# Patient Record
Sex: Male | Born: 1935 | Race: Black or African American | Hispanic: No | Marital: Married | Smoking: Former smoker
Health system: Southern US, Community
[De-identification: ages and names within clinical notes are randomized; demographics above are authoritative.]

## PROBLEM LIST (undated history)

## (undated) DIAGNOSIS — R7303 Prediabetes: Secondary | ICD-10-CM

## (undated) DIAGNOSIS — N529 Male erectile dysfunction, unspecified: Secondary | ICD-10-CM

## (undated) DIAGNOSIS — I10 Essential (primary) hypertension: Secondary | ICD-10-CM

## (undated) DIAGNOSIS — K219 Gastro-esophageal reflux disease without esophagitis: Secondary | ICD-10-CM

## (undated) HISTORY — DX: Male erectile dysfunction, unspecified: N52.9

## (undated) HISTORY — PX: COLONOSCOPY, ESOPHAGOGASTRODUODENOSCOPY (EGD) AND ESOPHAGEAL DILATION: SHX5781

## (undated) HISTORY — DX: Essential (primary) hypertension: I10

## (undated) HISTORY — DX: Gastro-esophageal reflux disease without esophagitis: K21.9

## (undated) HISTORY — DX: Prediabetes: R73.03

---

## 2007-11-29 ENCOUNTER — Emergency Department: Payer: Self-pay | Admitting: Emergency Medicine

## 2007-11-29 ENCOUNTER — Other Ambulatory Visit: Payer: Self-pay

## 2010-08-11 ENCOUNTER — Ambulatory Visit: Payer: Self-pay | Admitting: Family Medicine

## 2010-08-16 ENCOUNTER — Ambulatory Visit: Payer: Self-pay | Admitting: Family Medicine

## 2011-03-04 ENCOUNTER — Inpatient Hospital Stay: Payer: Self-pay | Admitting: Internal Medicine

## 2011-03-17 ENCOUNTER — Ambulatory Visit: Payer: Self-pay | Admitting: Unknown Physician Specialty

## 2011-03-21 LAB — PATHOLOGY REPORT

## 2013-07-09 IMAGING — CT CT ABD-PELV W/ CM
2 of 5 series · 12 of 32 positions shown, 18 images · non-contrast
Comparison: none

REASON FOR EXAM: (1) fever, vomiting, possible pyelonephritis; (2) fever,
vomiting, possible pyel
COMMENTS:

[Series 8: delays soft tissue · axial · 0.65mm/px · z∈[-475,-73]mm · 10 of 164 slices shown, 16 images]
[im 15/164  soft-tissue]
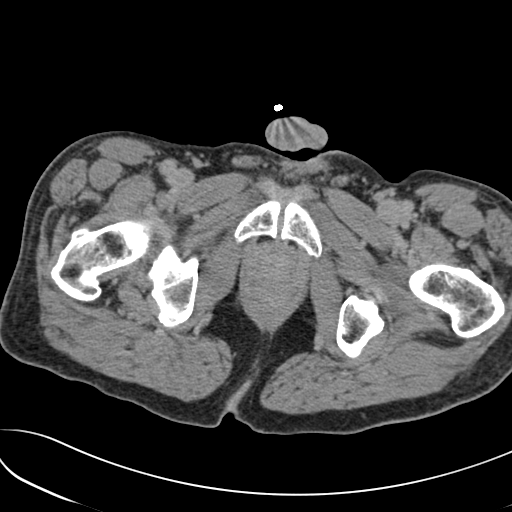
[im 15/164  bone]
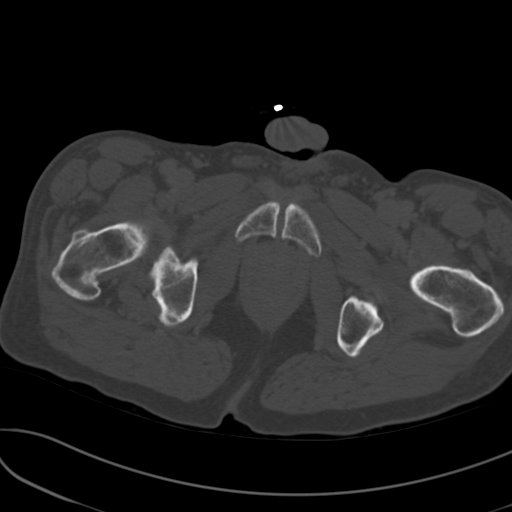
[im 30/164  soft-tissue]
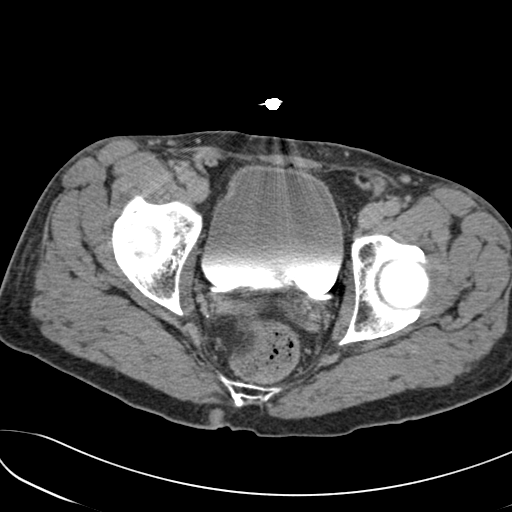
[im 45/164  soft-tissue]
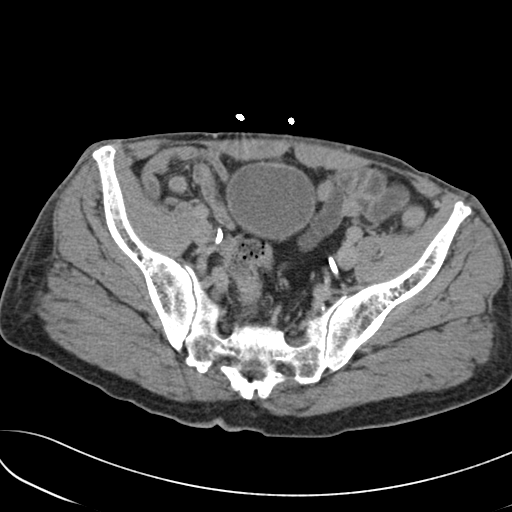
[im 60/164  soft-tissue]
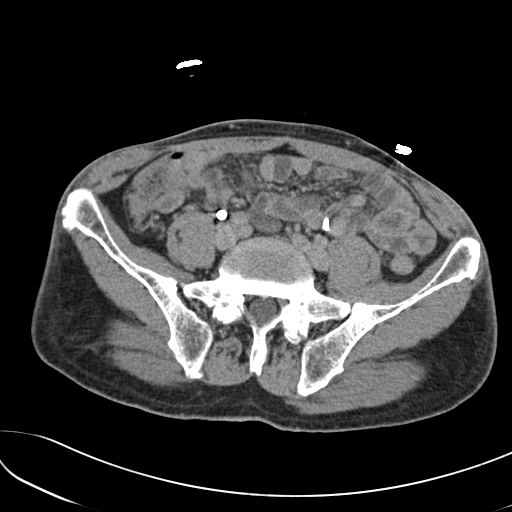
[im 75/164  soft-tissue]
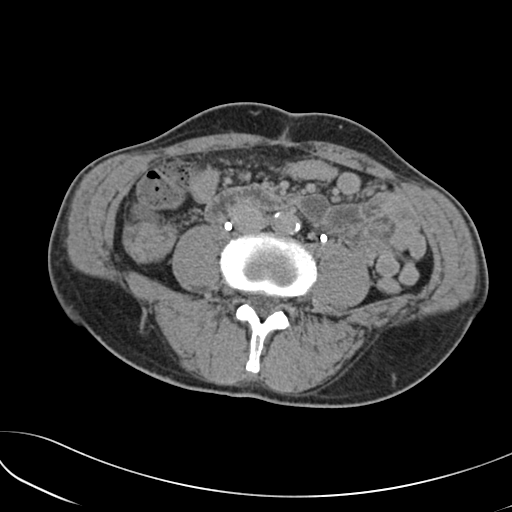
[im 89/164  soft-tissue]
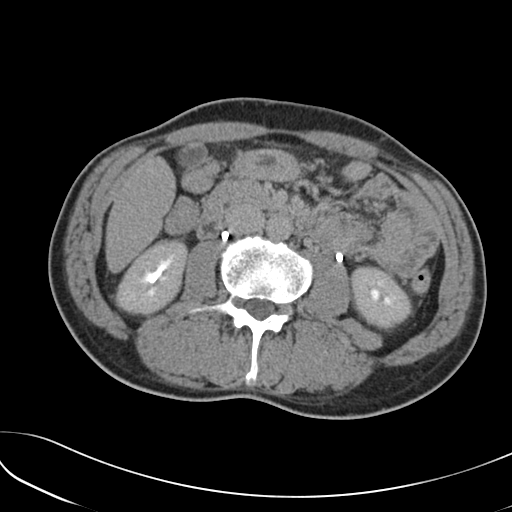
[im 104/164  soft-tissue]
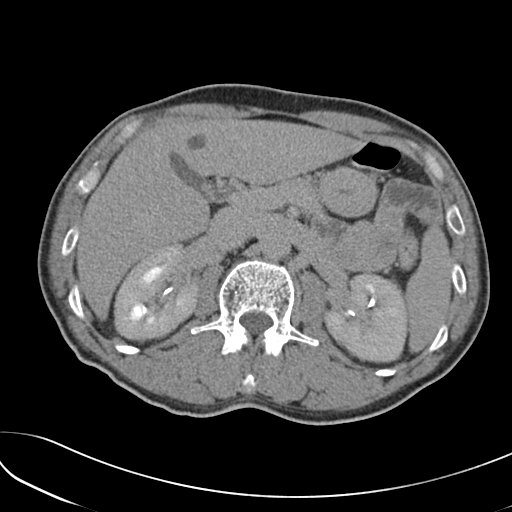
[im 104/164  lung]
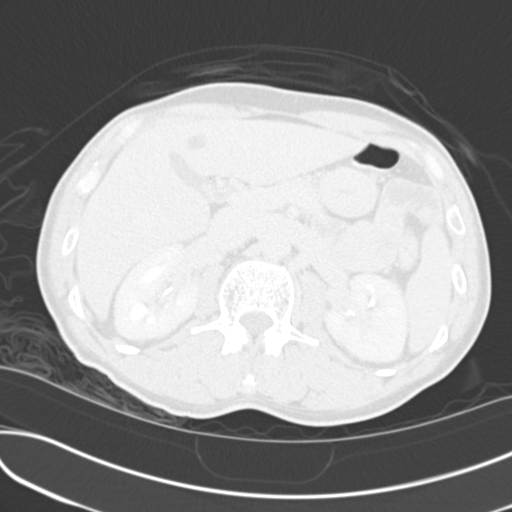
[im 119/164  soft-tissue]
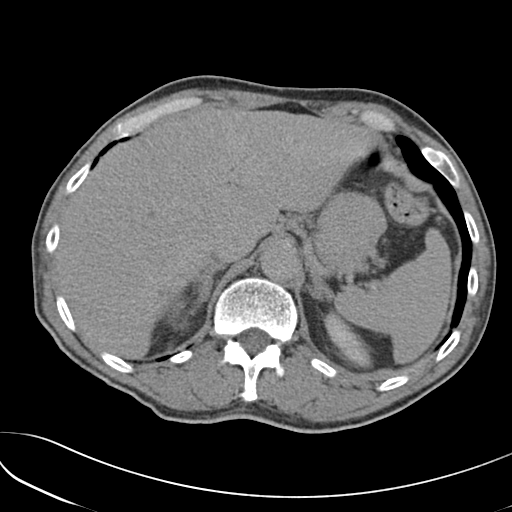
[im 119/164  lung]
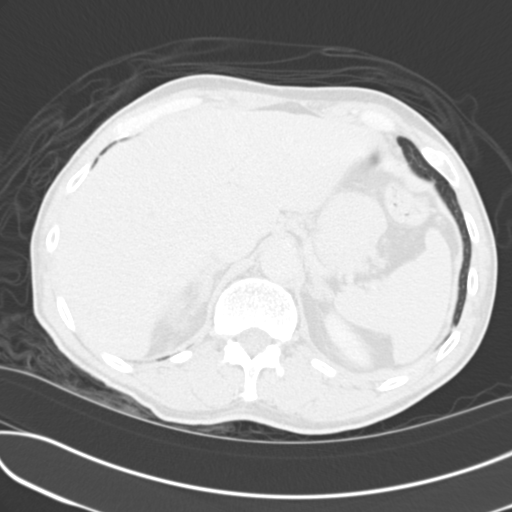
[im 134/164  soft-tissue]
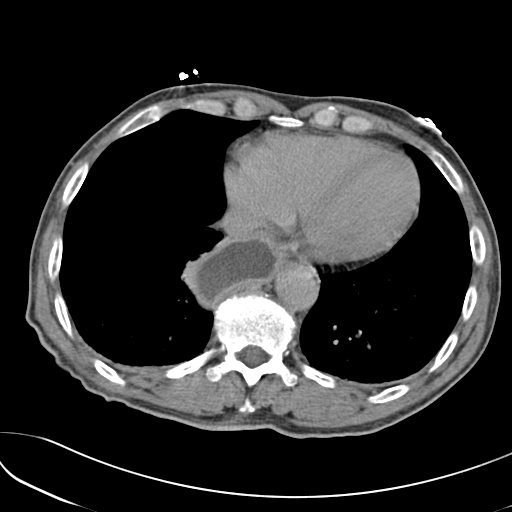
[im 134/164  lung]
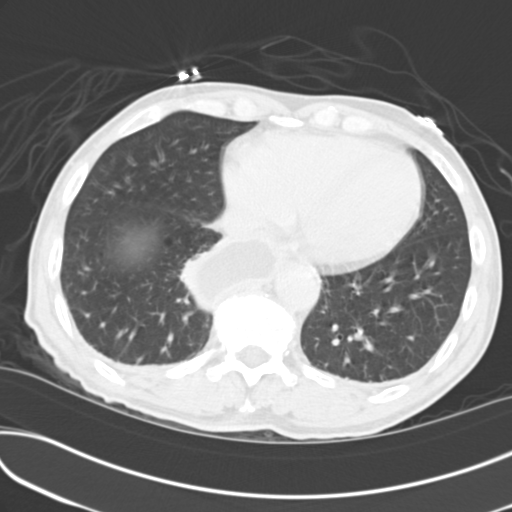
[im 134/164  bone]
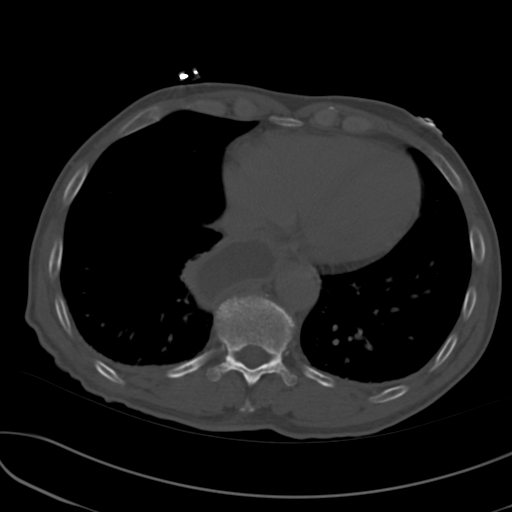
[im 149/164  soft-tissue]
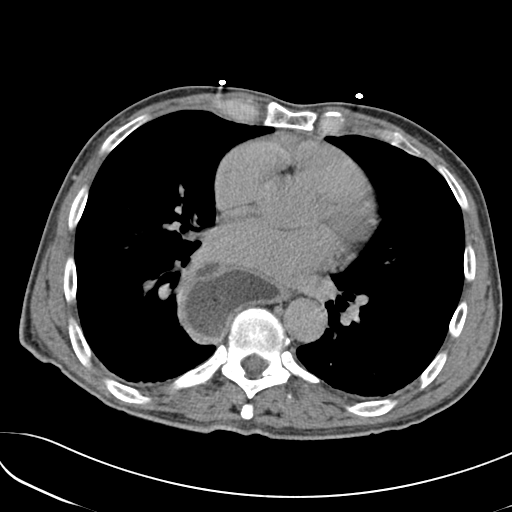
[im 149/164  lung]
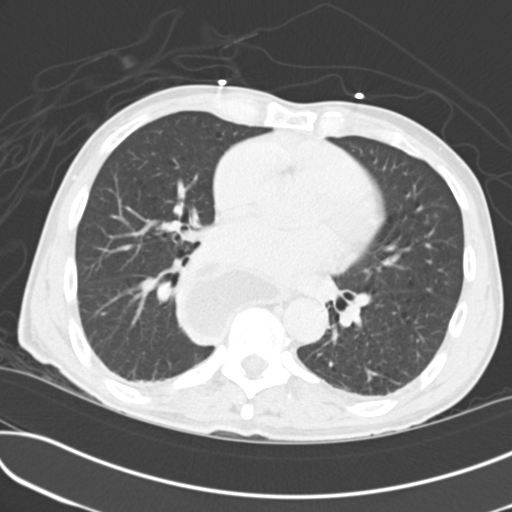

[Series 10: lung windows · axial · 0.65mm/px · z∈[-136,-82]mm · 2 of 55 slices shown]
[im 19/55  bone]
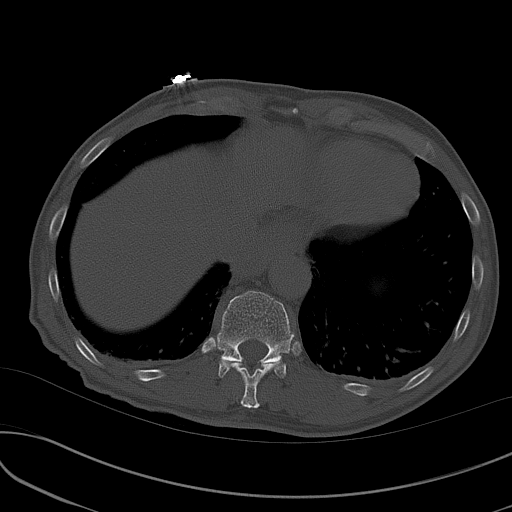
[im 37/55  bone]
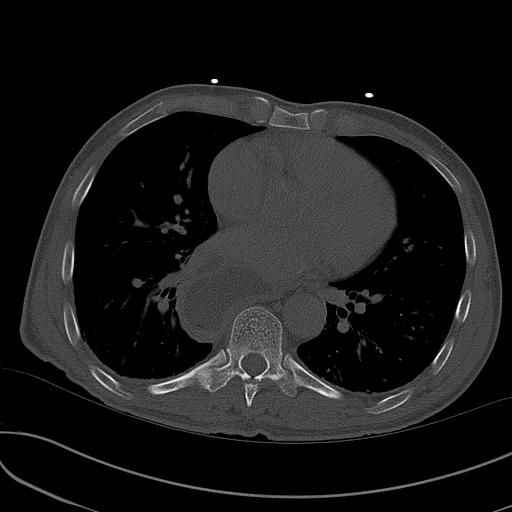

[12 of 32 positions shown; findings below may reference images not displayed]

PROCEDURE:     CT  - CT ABDOMEN / PELVIS  W  - March 04, 2011  [DATE]

RESULT:     Axial imaging was performed through the abdomen and pelvis with
reconstructions at 3 mm intervals and slice thicknesses. The patient
received 100 cc of [id number]. The patient did not receive oral contrast.
Review of multiplanar reconstructed images was performed separately on the
VIA monitor.

There is marked abnormality of the visualized portions of the thoracic
esophagus which is not new when compared to the study from August 2010. The
visualized portion of the esophagus is dilated, demonstrates a thickened
wall and contains fluid. The stomach itself is nondilated.

The liver exhibits a well-circumscribed 1.5 cm diameter hypodensity in the
left lobe with Hounsfield measurement of -1 most compatible with a cyst.
Other smaller similar-appearing findings are noted in the right lobe. I see
no evidence of gallstones. The spleen is not enlarged. The pancreas, adrenal
glands, and periaortic and pericaval regions are normal in appearance. The
kidneys enhance well. There is no evidence of obstruction. I see no
suspicious parenchymal enhancement pattern to suggest pyelonephritis. The
perinephric fat is normal in appearance.

The unopacified loops of small and large bowel exhibit no evidence of ileus
nor of obstruction. There is a structure which likely reflects a normal
appendix in the right aspect of the pelvis. The urinary bladder is
moderately distended. A prominent impression upon its base by the enlarged
prostate gland is present.
IMPRESSION: 1. There is an abnormal appearance of the lower esophagus. On a CT scan from
August 2010 a somewhat similar appearance was demonstrated the there may be
slightly more wall thickening currently. This could indicate acute
inflammation. There may be obstruction at the GE junction given the
patient's vomiting. Consideration could be given for performing CT scanning
of the chest.
2. There are likely cysts within the liver.
3. I see no findings to suggest pyelonephritis nor other acute urinary tract
abnormality.
4. I do not see objective evidence of acute bowel abnormality.
5. There is no pneumonia in the visualized portions of the lung parenchyma.

Correlation with any previous workup of the esophagus and stomach is needed.
Gastroenterologic oral or gentle surgical evaluation may be indicated now.

A preliminary report was sent to the [HOSPITAL] the conclusion
of the study.

## 2014-06-29 NOTE — Discharge Summary (Signed)
PATIENT NAME:  Paul Paul [name], Paul Paul [name] MR#:  [id number] DATE OF BIRTH:  13-May-1935  DATE OF ADMISSION:  03/04/2011 DATE OF DISCHARGE:  03/05/2011  DISCHARGE DIAGNOSES:   1. Fever, likely because of viral syndrome.   2. Mild urine infection.   3. Iron deficiency anemia.   4. Possible achalasia.   5.   6. Hypertension.    MEDICATIONS:   1. Norvasc 10 mg p.o. daily.  2. Zyrtec 10 mg p.o. daily.  NEW MEDICINES:  1. Prilosec 20 mg p.o. daily.  2. Cipro 500 mg p.o. b.i.d. for 5 days. 3. Ferrous sulfate 325 mg p.o. daily.   ALLERGIES: No known allergies.   CONSULTATION:  GI consult with Dr. Mechele CollinElliott.   HOSPITAL COURSE:  1. The patient is Paul [name] 79 year old male with history of hypertension, came in because of fever, vomiting, diarrhea, dizziness, muscle aches, temperature 101. The patient also had some trouble swallowing for Paul [name] few years. The patient's blood cultures have been negative. Urine cultures have been negative. Chest x-ray showed Paul [name] questionable pneumonia pattern, and patient's white count was elevated to 22 on admission. The patient's hemoglobin 12.7, BUN of 18, creatinine 0.99. The patient admitted to hospitalist service for fever with possible urinary tract infection, pyelonephritis. The patient started on Rocephin along with IV fluids. Patient's initial urinalysis showed leukocyte esterase negative with 1+ bacteria. Urine cultures have been negative. CT of the head was done for dizziness which showed mild involutional changes, no acute abnormality. Chest x-ray showed questionable mild congestive heart failure versus early pneumonia. The patient did much better with white count dropped to 11.7 today and also fever spikes have resolved, had Paul [name] little bit temperature this morning at 99.5 but has been afebrile the whole day yesterday and blood pressure has been fine. He says he feels better and tolerating the diet and dizziness has resolved. He is stable for discharge and we are finishing the course of  Cipro for mild cystitis even though cultures are negative.  2. Possible achalasia. The patient has trouble swallowing for liquids for years, and also patient is seen by Dr. Mechele CollinElliott. His iron studies showed iron levels of 18. CT of the abdomen done which showed the patient has abnormal esophagus.  Thickening is present. Patient has visualized portion of esophagus is dilated and demonstrated thick wall containing fluid so Dr Mechele CollinElliott saw and thought it could be achalasia, and patient did not decide whether to have endoscopy today or not. Anyway he ate breakfast, and he could not have an endoscopy today, and tomorrow is Sunday.  Dr. Mechele CollinElliott mentioned that he can see him in the office on Monday and schedule for an endoscopy. The patient and the family agreed for it, and he has appointment on Monday, March 07, 2011 at 2:00 o'clock with Dr. Mechele CollinElliott. The patient also given supplemental iron.  So this dysphagia of long duration which is not progressive is less likely Paul [name] carcinoma. The patient's condition is stable.   TIME SPENT ON DISCHARGE PREPARATION:  More than 30 minutes.     ____________________________ Katha HammingSnehalatha Saad Buhl, MD sk:vtd D: 03/05/2011 11:30:19 ET T: 03/09/2011 11:13:05 ET JOB#: [id number]  cc: Katha HammingSnehalatha Mayanna Garlitz, MD, <Dictator> Katha HammingSNEHALATHA Mikaela Hilgeman MD ELECTRONICALLY SIGNED 03/20/2011 16:49

## 2014-06-29 NOTE — Consult Note (Signed)
PATIENT NAME:  Paul [name], Paul [name] MR#:  [id number] DATE OF BIRTH:  22-Dec-1935  DATE OF CONSULTATION:  03/04/2011  REFERRING PHYSICIAN:  Riki Sheer, MD CONSULTING PHYSICIAN:  Scot Jun, MD  HISTORY OF PRESENT ILLNESS:  The patient is a 79 year old black male who was admitted to the hospital because of fever, vomiting, diarrhea, dizziness, muscle aches, temperature up to 101 on admission.  I was asked to see him after a CT scan showed a thickened lower esophagus, a fluid level.  The patient has had trouble with food swallowing for years, especially meats- chicken or steak.  This has not been progressive. He just avoids the foods that cause problems.  He has lost maybe seven or eight pounds in the last couple of weeks during this illness.  He has never had an upper endoscopy or colonoscopy.    ALLERGIES:  No known drug allergies.  CURRENT MEDICATIONS:  1. Zyrtec 10 mg daily. 2. Norvasc 10 mg daily.  SOCIAL HISTORY:  Quit smoking 1988, quit drinking 1988.  Worked as a Games developer.  FAMILY HISTORY:  Positive for heart disease, positive for breast cancer in his sister.  REVIEW OF SYSTEMS:  He did have fever and chills, did have headache, which is gone now, as well as the fever and chills.  He feels much better than he did yesterday.  No hematemesis, no melena.  He never had a colonoscopy before.   PHYSICAL EXAMINATION: GENERAL:  Thin black male in no acute distress.  HEENT:  Sclerae anicteric.  Conjunctivae negative. Tongue negative.  Head is atraumatic.    NECK:  Trachea is in the midline.  CHEST:  Clear.  HEART:  No murmurs or gallops I can hear.  There appears to be an S4.  ABDOMEN:  Flat, nontender. No hepatosplenomegaly.  No masses.  No bruits.  EXTREMITIES:  No edema.  The patient is a fairly thin individual.   RECTAL:  Exam not done at this time.  LABORATORY DATA:  Urinalysis with 8 white cells, 2 red cells.  CT of the abdomen and pelvis showed thickened appearance  of the esophagus similar to appearance in June 2012, slightly more thickening.  Cyst in the liver.  Perinephric fat is normal in appearance.  Prostate somewhat enlarged causing a prominent impression upon the base of the bladder.  White count was 22.8.  Hemoglobin 12.7.  CPK, CPK-MB negative.  Glucose 144, BUN 18, creatinine 0.9.  Sodium 136, potassium 4.2, chloride 92, CO2 22, calcium 8.2, total bili 0.4, alkaline phosphatase 64, ALT 12, AST 20.  Albumin 3.1.  Lipase 55.  CT of the brain is negative.  ASSESSMENT:  Given his rather long duration of swallowing difficulty with a fluid level in the esophagus, thickening of the wall, achalasia is a prime consideration.  It appears that the dysphagia is not progressive, so it makes carcinoma somewhat less likely but does not, of course, rule it out.   The patient appears to have been treated for a pyelonephritis. It is possible he could also have had a viral syndrome with dehydration, although his white count was 22.8.  It is possible that he could have had some prostatitis as well.  I offered the patient an endoscopy while he is here in the hospital versus doing this as an outpatient, and he prefers to wait and have this done later as an outpatient.  I told him and his family if they change their mind I would be happy to do it  while he is here.  I gave him a voided prescription pad with my name and number on it for him to contact.    ____________________________ Scot Junobert T. Sian Joles, MD rte:bjt D: 03/04/2011 16:24:59 ET T: 03/04/2011 16:35:45 ET JOB#: [id number]  cc: Scot Junobert T. Jaiyah Beining, MD, <Dictator> Leanna SatoLinda M. Miles, MD Scot JunOBERT T Devone Bonilla MD ELECTRONICALLY SIGNED 03/17/2011 12:08

## 2020-06-11 ENCOUNTER — Other Ambulatory Visit: Payer: Self-pay

## 2020-06-11 ENCOUNTER — Encounter: Payer: Self-pay | Admitting: Urology

## 2020-06-11 ENCOUNTER — Ambulatory Visit: Payer: Medicare HMO | Admitting: Urology

## 2020-06-11 VITALS — BP 105/56 | HR 88 | Ht 69.0 in | Wt 132.0 lb

## 2020-06-11 DIAGNOSIS — R972 Elevated prostate specific antigen [PSA]: Secondary | ICD-10-CM

## 2020-06-11 DIAGNOSIS — R350 Frequency of micturition: Secondary | ICD-10-CM

## 2020-06-11 LAB — URINALYSIS, COMPLETE
Bilirubin, UA: NEGATIVE
Glucose, UA: NEGATIVE
Ketones, UA: NEGATIVE
Leukocytes,UA: NEGATIVE
Nitrite, UA: NEGATIVE
Protein,UA: NEGATIVE
RBC, UA: NEGATIVE
Specific Gravity, UA: 1.02 (ref 1.005–1.030)
Urobilinogen, Ur: 0.2 mg/dL (ref 0.2–1.0)
pH, UA: 7 (ref 5.0–7.5)

## 2020-06-11 LAB — MICROSCOPIC EXAMINATION
Bacteria, UA: NONE SEEN
RBC, Urine: NONE SEEN /hpf (ref 0–2)
WBC, UA: NONE SEEN /hpf (ref 0–5)

## 2020-06-11 LAB — BLADDER SCAN AMB NON-IMAGING

## 2020-06-11 NOTE — Progress Notes (Signed)
06/11/2020 11:28 AM   Paul Kohut [place] Jr. June 30, 1935 [id number]  Referring provider: Leanna Sato, MD [address] [place],  [place] [address]  Chief Complaint  Patient presents with  . Elevated PSA    New Patient    HPI: Paul [name], Paul [name]. is an 85 y.o. male referred for an elevated PSA.   Long history of an elevated PSA dating back to 2008 when PSA was 9.13 November 2006  He declined urology referral; PSA in 2013 was 10.1 and he again declined referral  Saw Dr. Marvis Moeller 05/11/2020 with complaints of urinary frequency and urgency  Dipstick urine was nitrite positive with small leukocytes and he was treated with Cipro with resolution of his symptoms  PSA was repeated at that visit which was 17.5 and he agreed to urology referral  Presently has no bothersome voiding complaints; IPSS 8/35  Denies dysuria, gross hematuria  Denies flank, abdominal or pelvic pain      PMH: Past Medical History:  Diagnosis Date  . ED (erectile dysfunction)   . GERD (gastroesophageal reflux disease)   . Hypertension   . Prediabetes     Surgical History: Past Surgical History:  Procedure Laterality Date  . COLONOSCOPY, ESOPHAGOGASTRODUODENOSCOPY (EGD) AND ESOPHAGEAL DILATION      Home Medications:  Allergies as of 06/11/2020   No Known Allergies     Medication List       Accurate as of June 11, 2020 11:28 AM. If you have any questions, ask your nurse or doctor.        amLODipine 10 MG tablet Commonly known as: NORVASC Take 1 tablet by mouth daily.   Garlic 10 MG Caps Take by mouth.   ZyrTEC Allergy 10 MG Caps Generic drug: Cetirizine HCl Take by mouth.       Allergies: No Known Allergies  Family History: Family History  Problem Relation Age of Onset  . Prostate cancer Neg Hx   . Bladder Cancer Neg Hx   . Kidney cancer Neg Hx     Social History:  reports that he has quit smoking. He has never used smokeless tobacco. He reports that he does not use drugs.  No history on file for alcohol use.   Physical Exam: BP (!) 105/56   Pulse 88   Ht 5\' 9"  (1.753 m)   Wt 132 lb (59.9 kg)   BMI 19.49 kg/m   Constitutional:  Alert and oriented, No acute distress. HEENT: [place] AT, moist mucus membranes.  Trachea midline, no masses. Cardiovascular: No clubbing, cyanosis, or edema. Respiratory: Normal respiratory effort, no increased work of breathing. GI: Abdomen is soft, nontender, nondistended, no abdominal masses GU: Prostate 60+ grams, smooth without nodules Neurologic: Grossly intact, no focal deficits, moving all 4 extremities. Psychiatric: Normal mood and affect.  Laboratory Data:  Urinalysis Dipstick/microscopy negative   Assessment & Plan:    1.  Elevated PSA  PSA 9.8 in 2008 and 10.1 in 2013  PSA was repeated March 2022 at the time of urinary tract infection and was 17.5  Discussed with patient and his wife that an activity UTI can elevate the PSA   Urinalysis today was clear and will repeat his PSA  If it returns to baseline ~ 10 would not recommend any further evaluation including prostate biopsy based on age and stability over the last 13 years  We discussed that ~ 50% of men in their 61s will have low grade prostate cancer but this is not treated  If  PSA remains elevated above baseline we discussed the option of prostate biopsy versus continued surveillance and they will think over these options   2. BPH with mild LUTS  Mild LUTS which are not bothersome  Bladder scan PVR 86 mL   Riki Altes, MD  [facility] [address] [place], [place] [address] [phone]

## 2020-06-12 LAB — PSA: Prostate Specific Ag, Serum: 23 ng/mL — ABNORMAL HIGH (ref 0.0–4.0)

## 2020-06-15 ENCOUNTER — Telehealth: Payer: Self-pay | Admitting: *Deleted

## 2020-06-15 NOTE — Telephone Encounter (Signed)
-----   Message from Riki Altes, MD sent at 06/13/2020 11:08 AM EDT ----- PSA was significantly higher than when last checked at 23.0.  Based on the significant increase and normal urinalysis would recommend prostate biopsy.

## 2020-06-15 NOTE — Telephone Encounter (Signed)
Incoming call from patient and wife, state that they are returning a call. Informed them both of the information below. Wife gave verbal understanding. They state they will discuss this and give [place] a call back with their decision on whether or not they will schedule.

## 2020-07-01 ENCOUNTER — Telehealth: Payer: Self-pay | Admitting: *Deleted

## 2020-07-01 DIAGNOSIS — R972 Elevated prostate specific antigen [PSA]: Secondary | ICD-10-CM

## 2020-07-01 NOTE — Telephone Encounter (Signed)
Patient called in today and states he does not want to have a prostate biopsy done.

## 2020-07-01 NOTE — Telephone Encounter (Signed)
Please verify that he is willing to accept the risk of undiagnosed significant prostate cancer.  Also see if he wants to obtain a follow-up PSA in 6 months.

## 2020-07-03 NOTE — Telephone Encounter (Signed)
Attempted to reach patient no answer, mailbox is full and cannot leave a message

## 2020-07-16 NOTE — Telephone Encounter (Signed)
Spoke with patient and discussed in detail that not proceeding with a prostate biopsy would mean that he has the risk of undiagnosed prostate cancer. With the elevation in his PSA it is highly recommended that he have this procedure done.  Patient verbalized understanding and states he understands that he is putting himself at risk for undiagnosed prostate cancer and still does not wish to proceed with a biopsy at this time. He has no additional questions and did not wish to discuss the biopsy details further. He was agreeable to a 6 month follow up with PSA draw prior. These appointments were scheduled and PSA was ordered.

## 2020-07-16 NOTE — Addendum Note (Signed)
Addended by: Martha Clan on: 07/16/2020 01:57 PM   Modules accepted: Orders

## 2021-01-01 ENCOUNTER — Other Ambulatory Visit: Payer: Self-pay

## 2021-01-06 ENCOUNTER — Ambulatory Visit: Payer: Self-pay | Admitting: Urology

## 2021-09-21 ENCOUNTER — Telehealth: Payer: Self-pay | Admitting: Urology

## 2021-09-21 NOTE — Telephone Encounter (Signed)
Notified patient as instructed.   

## 2021-09-21 NOTE — Telephone Encounter (Signed)
PSA has risen to 14.6.  Recommend scheduling follow-up office visit.

## 2021-09-21 NOTE — Telephone Encounter (Signed)
Mailbox full

## 2021-10-01 ENCOUNTER — Encounter: Payer: Self-pay | Admitting: Urology

## 2021-10-01 ENCOUNTER — Ambulatory Visit (INDEPENDENT_AMBULATORY_CARE_PROVIDER_SITE_OTHER): Payer: Medicare HMO | Admitting: Urology

## 2021-10-01 VITALS — BP 106/63 | HR 94 | Ht 69.0 in | Wt 111.0 lb

## 2021-10-01 DIAGNOSIS — R972 Elevated prostate specific antigen [PSA]: Secondary | ICD-10-CM

## 2021-10-01 NOTE — Progress Notes (Unsigned)
   10/01/2021 12:50 PM   Paul Kohut [place] Jr. 1935/10/05 [id number]  Referring provider: Leanna Sato, MD [address] [place],  [place] [address]  Chief Complaint  Patient presents with   Elevated PSA    HPI: 86 y.o. male referred for follow-up of an elevated PSA.  He presents today with his wife  Refer to my prior note 06/26/2021 PSA was as high as 23 Prostate biopsy was recommended and he refused indicating he understood the risks of undiagnosed, prostate cancer which could lead to metastatic disease and even death Recent PSA performed by PCP was 14.6 No bothersome LUTS Denies dysuria or gross hematuria   PMH: Past Medical History:  Diagnosis Date   ED (erectile dysfunction)    GERD (gastroesophageal reflux disease)    Hypertension    Prediabetes     Surgical History: Past Surgical History:  Procedure Laterality Date   COLONOSCOPY, ESOPHAGOGASTRODUODENOSCOPY (EGD) AND ESOPHAGEAL DILATION      Home Medications:  Allergies as of 10/01/2021   No Known Allergies      Medication List        Accurate as of October 01, 2021 12:50 PM. If you have any questions, ask your nurse or doctor.          amLODipine 10 MG tablet Commonly known as: NORVASC Take 1 tablet by mouth daily.   Ensure Take by mouth.   Garlic 10 MG Caps Take by mouth.   ZyrTEC Allergy 10 MG Caps Generic drug: Cetirizine HCl Take by mouth.        Allergies: No Known Allergies  Family History: Family History  Problem Relation Age of Onset   Prostate cancer Neg Hx    Bladder Cancer Neg Hx    Kidney cancer Neg Hx     Social History:  reports that he has quit smoking. He has never used smokeless tobacco. He reports that he does not use drugs. No history on file for alcohol use.   Physical Exam: BP 106/63   Pulse 94   Ht 5\' 9"  (1.753 m)   Wt 111 lb (50.3 kg)   BMI 16.39 kg/m   Constitutional:  Alert and oriented, No acute distress. HEENT: [place] AT Respiratory: Normal  respiratory effort, no increased work of breathing. Psychiatric: Normal mood and affect.   Assessment & Plan:    1.  Elevated PSA PSA remains elevated above 10 and I again recommended prostate biopsy.  The procedure was discussed and also offered to perform in same-day surgery under sedation He again refuses biopsy and states he is willing to accept the risk of undiagnosed prostate cancer which could ultimately proceed to metastatic disease and even death Do not need to see in follow-up unless he decides he wants to have biopsy.  They were informed they could call the office to schedule a biopsy and he would not need to be seen prior to scheduling   , MD  [facility] [address] [place], [place] [phone]

## 2021-10-12 ENCOUNTER — Inpatient Hospital Stay
Admission: EM | Admit: 2021-10-12 | Discharge: 2021-10-14 | DRG: 872 | Disposition: A | Discharge status: Home Health | Payer: Medicare HMO | Attending: Internal Medicine | Admitting: Internal Medicine

## 2021-10-12 ENCOUNTER — Other Ambulatory Visit: Payer: Self-pay

## 2021-10-12 ENCOUNTER — Encounter: Payer: Self-pay | Admitting: Internal Medicine

## 2021-10-12 DIAGNOSIS — K222 Esophageal obstruction: Secondary | ICD-10-CM | POA: Diagnosis present

## 2021-10-12 DIAGNOSIS — A419 Sepsis, unspecified organism: Principal | ICD-10-CM

## 2021-10-12 DIAGNOSIS — R1319 Other dysphagia: Secondary | ICD-10-CM

## 2021-10-12 DIAGNOSIS — R338 Other retention of urine: Secondary | ICD-10-CM | POA: Diagnosis present

## 2021-10-12 DIAGNOSIS — K219 Gastro-esophageal reflux disease without esophagitis: Secondary | ICD-10-CM | POA: Diagnosis present

## 2021-10-12 DIAGNOSIS — R339 Retention of urine, unspecified: Principal | ICD-10-CM

## 2021-10-12 DIAGNOSIS — N3000 Acute cystitis without hematuria: Secondary | ICD-10-CM

## 2021-10-12 DIAGNOSIS — N401 Enlarged prostate with lower urinary tract symptoms: Secondary | ICD-10-CM | POA: Diagnosis present

## 2021-10-12 DIAGNOSIS — R1310 Dysphagia, unspecified: Secondary | ICD-10-CM | POA: Diagnosis present

## 2021-10-12 DIAGNOSIS — R64 Cachexia: Secondary | ICD-10-CM | POA: Diagnosis present

## 2021-10-12 DIAGNOSIS — R636 Underweight: Secondary | ICD-10-CM | POA: Diagnosis present

## 2021-10-12 DIAGNOSIS — Z66 Do not resuscitate: Secondary | ICD-10-CM | POA: Diagnosis present

## 2021-10-12 DIAGNOSIS — R531 Weakness: Secondary | ICD-10-CM | POA: Diagnosis present

## 2021-10-12 DIAGNOSIS — R7303 Prediabetes: Secondary | ICD-10-CM | POA: Diagnosis present

## 2021-10-12 DIAGNOSIS — N390 Urinary tract infection, site not specified: Secondary | ICD-10-CM | POA: Diagnosis present

## 2021-10-12 DIAGNOSIS — Z681 Body mass index (BMI) 19 or less, adult: Secondary | ICD-10-CM | POA: Diagnosis not present

## 2021-10-12 DIAGNOSIS — I10 Essential (primary) hypertension: Secondary | ICD-10-CM | POA: Diagnosis present

## 2021-10-12 LAB — BASIC METABOLIC PANEL
Anion gap: 11 (ref 5–15)
BUN: 18 mg/dL (ref 8–23)
CO2: 23 mmol/L (ref 22–32)
Calcium: 9.9 mg/dL (ref 8.9–10.3)
Chloride: 104 mmol/L (ref 98–111)
Creatinine, Ser: 0.91 mg/dL (ref 0.61–1.24)
GFR, Estimated: >60 mL/min (ref >60)
Glucose, Bld: 122 mg/dL — ABNORMAL HIGH (ref 70–99)
Potassium: 3.6 mmol/L (ref 3.5–5.1)
Sodium: 138 mmol/L (ref 135–145)

## 2021-10-12 LAB — URINALYSIS, ROUTINE W REFLEX MICROSCOPIC
Bilirubin Urine: NEGATIVE
Glucose, UA: 50 mg/dL — AB
Ketones, ur: 5 mg/dL — AB
Nitrite: NEGATIVE
Protein, ur: 100 mg/dL — AB
Specific Gravity, Urine: 1.018 (ref 1.005–1.030)
Squamous Epithelial / HPF: NONE SEEN (ref 0–5)
WBC, UA: >50 WBC/hpf — ABNORMAL HIGH (ref 0–5)
pH: 5 (ref 5.0–8.0)

## 2021-10-12 LAB — HEPATIC FUNCTION PANEL
ALT: 14 U/L (ref 0–44)
AST: 36 U/L (ref 15–41)
Albumin: 3.1 g/dL — ABNORMAL LOW (ref 3.5–5.0)
Alkaline Phosphatase: 67 U/L (ref 38–126)
Bilirubin, Direct: <0.1 mg/dL (ref 0.0–0.2)
Total Bilirubin: 0.8 mg/dL (ref 0.3–1.2)
Total Protein: 7.6 g/dL (ref 6.5–8.1)

## 2021-10-12 LAB — CBC
HCT: 40.8 % (ref 39.0–52.0)
Hemoglobin: 13.2 g/dL (ref 13.0–17.0)
MCH: 27.8 pg (ref 26.0–34.0)
MCHC: 32.4 g/dL (ref 30.0–36.0)
MCV: 85.9 fL (ref 80.0–100.0)
Platelets: 342 10*3/uL (ref 150–400)
RBC: 4.75 MIL/uL (ref 4.22–5.81)
RDW: 15.4 % (ref 11.5–15.5)
WBC: 19.6 10*3/uL — ABNORMAL HIGH (ref 4.0–10.5)
nRBC: 0 % (ref 0.0–0.2)

## 2021-10-12 LAB — LIPASE, BLOOD: Lipase: 29 U/L (ref 11–51)

## 2021-10-12 LAB — LACTIC ACID, PLASMA
Lactic Acid, Venous: 1.8 mmol/L (ref 0.5–1.9)
Lactic Acid, Venous: 1 mmol/L (ref 0.5–1.9)

## 2021-10-12 MED ORDER — SODIUM CHLORIDE 0.9 % IV SOLN
1.0000 g | Freq: Once | INTRAVENOUS | Status: AC
  Administered 2021-10-12: 1 g via INTRAVENOUS
  Filled 2021-10-12: qty 10

## 2021-10-12 MED ORDER — ONDANSETRON HCL 4 MG/2ML IJ SOLN: 4.0000 mg | Freq: Four times a day (QID) | INTRAMUSCULAR | Status: DC | PRN

## 2021-10-12 MED ORDER — LACTATED RINGERS IV BOLUS
1000.0000 mL | Freq: Once | INTRAVENOUS | Status: AC
  Administered 2021-10-12: 1000 mL via INTRAVENOUS

## 2021-10-12 MED ORDER — ACETAMINOPHEN 325 MG PO TABS: 650.0000 mg | ORAL_TABLET | Freq: Four times a day (QID) | ORAL | Status: DC | PRN

## 2021-10-12 MED ORDER — ACETAMINOPHEN 650 MG RE SUPP: 650.0000 mg | Freq: Four times a day (QID) | RECTAL | Status: DC | PRN

## 2021-10-12 MED ORDER — POLYETHYLENE GLYCOL 3350 17 G PO PACK: 17.0000 g | PACK | Freq: Every day | ORAL | Status: DC | PRN

## 2021-10-12 MED ORDER — ONDANSETRON HCL 4 MG PO TABS: 4.0000 mg | ORAL_TABLET | Freq: Four times a day (QID) | ORAL | Status: DC | PRN

## 2021-10-12 MED ORDER — ENOXAPARIN SODIUM 40 MG/0.4ML IJ SOSY
40.0000 mg | PREFILLED_SYRINGE | INTRAMUSCULAR | Status: DC
  Administered 2021-10-13 – 2021-10-14 (×2): 40 mg via SUBCUTANEOUS
  Filled 2021-10-12 (×2): qty 0.4

## 2021-10-12 MED ORDER — SODIUM CHLORIDE 0.9 % IV SOLN
1.0000 g | INTRAVENOUS | Status: DC
  Administered 2021-10-13 – 2021-10-14 (×2): 1 g via INTRAVENOUS
  Filled 2021-10-12 (×2): qty 1

## 2021-10-12 NOTE — ED Triage Notes (Signed)
Pt here with weakness and prostates issue. Pt having trouble urinating. Pt denies pain.

## 2021-10-12 NOTE — Plan of Care (Signed)

## 2021-10-12 NOTE — ED Notes (Signed)
Bladder scan upon arrival showed greater than 910 ml urine in bladder. PA at bedside, foley placed pt tolerated well urine draining.

## 2021-10-12 NOTE — ED Triage Notes (Signed)
Pt here via [facility] with weakness x2-3 days. Pt still eating as normal. Pt has 1 bad kidney, pt denies pain. Family states pt needs his electrolytes replaced.   140 HR-down to 80s 120/70 98%

## 2021-10-12 NOTE — H&P (Signed)
History and Physical:    Paul [name] [employer].   SKA:[id number] DOB: [birth year] DOA: 10/12/2021  Referring [name]/provider: Chesley Noon, [name] PCP: Paul [name]   Patient coming from: Home  Chief Complaint: Difficulty urinating  History of Present Illness:   Paul [name]. is [name] 86 y.o. male past medical history significant for hypertension, GERD, elevated PSA, prediabetes, erectile dysfunction, GERD, enlarged prostate, who presented to the hospital because of generalized weakness and difficulty urinating for about 2 days.  He has been seen the urologist for elevated PSA (14.6) but he has declined biopsy.  He has been on liquid diet because of trouble swallowing solids.    ED Course:  The patient was found to have acute urinary retention and sepsis from UTI.  He was tachycardic with heart rate in the 140s.  Bladder scan showed greater than 900 mL of urine and Foley catheter was placed for acute urinary retention.  He was given IV fluids and IV ceftriaxone in the emergency room.   ROS:   ROS all other systems reviewed were negative  Past Medical History:   Past Medical History:  Diagnosis Date   ED (erectile dysfunction)    GERD (gastroesophageal reflux disease)    Hypertension    Prediabetes     Past Surgical History:   Past Surgical History:  Procedure Laterality Date   COLONOSCOPY, ESOPHAGOGASTRODUODENOSCOPY (EGD) AND ESOPHAGEAL DILATION      Social History:   Social History   Socioeconomic History   Marital status: Married    Spouse name: Not on file   Number of children: Not on file   Years of education: Not on file   Highest education level: Not on file  Occupational History   Not on file  Tobacco Use   Smoking status: Former   Smokeless tobacco: Never  Substance and Sexual Activity   Alcohol use: Not on file   Drug use: Never   Sexual activity: Not on file  Other Topics Concern   Not on file  Social History Narrative   Not on file   Social  Determinants of Health   Financial Resource Strain: Not on file  Food Insecurity: Not on file  Transportation Needs: Not on file  Physical Activity: Not on file  Stress: Not on file  Social Connections: Not on file  Intimate Partner Violence: Not on file    Allergies   Patient has no known allergies.  Family history:   Family History  Problem Relation Age of Onset   Prostate cancer Neg Hx    Bladder Cancer Neg Hx    Kidney cancer Neg Hx     Current Medications:   Prior to Admission medications   Medication Sig Start Date End Date Taking? Authorizing Provider  amLODipine (NORVASC) 10 MG tablet Take 1 tablet by mouth daily. 04/14/20   Provider, Historical, [name]  Cetirizine HCl (ZYRTEC ALLERGY) 10 MG CAPS Take by mouth.    Provider, Historical, [name]  Ensure (ENSURE) Take by mouth. 09/24/21   Provider, Historical, [name]  Garlic 10 MG CAPS Take by mouth.    Provider, Historical, [name]    Physical Exam:   Vitals:   10/12/21 2035 10/12/21 0953 10/12/21 1518 10/12/21 1530  BP: 118/78   120/69  Pulse: (!) 101   81  Resp: 18   15  Temp: 98.6 F (37 C)  98.2 F (36.8 C)   TempSrc: Oral  Oral   SpO2: 98%   99%  Weight:  50.3 kg    Height:  5\' 9"  (1.753 m)       Physical Exam: Blood pressure 120/69, pulse 81, temperature 98.2 F (36.8 C), temperature source Oral, resp. rate 15, height 5\' 9"  (1.753 m), weight 50.3 kg, SpO2 99 %. Gen: No acute distress, cachectic. Head: Normocephalic, atraumatic. Eyes: Pupils equal, round and reactive to light. Extraocular movements intact.  Sclerae nonicteric.  Mouth: Dry mucous membranes Neck: Supple, no thyromegaly, no lymphadenopathy, no jugular venous distention. Chest: Lungs are clear to auscultation with good air movement. No rales, rhonchi or wheezes.  CV: Heart sounds are regular with an S1, S2. No murmurs, rubs or gallops.  Abdomen: Soft, nontender, nondistended with normal active bowel sounds. No palpable masses. Extremities: Extremities  are without clubbing, or cyanosis. No edema. Pedal pulses 2+.  Skin: Warm and dry. No rashes, lesions or wounds Neuro: Alert and oriented times 3; grossly nonfocal.  Psych: Insight is good and judgment is appropriate. Mood and affect normal.   Data Review:    Labs: Basic Metabolic Panel: Recent Labs  Lab 10/12/21 0955  NA 138  K 3.6  CL 104  CO2 23  GLUCOSE 122*  BUN 18  CREATININE 0.91  CALCIUM 9.9   Liver Function Tests: Recent Labs  Lab 10/12/21 0955  AST 36  ALT 14  ALKPHOS 67  BILITOT 0.8  PROT 7.6  ALBUMIN 3.1*   Recent Labs  Lab 10/12/21 0955  LIPASE 29   No results for input(s): "AMMONIA" in the last 168 hours. CBC: Recent Labs  Lab 10/12/21 0955  WBC 19.6*  HGB 13.2  HCT 40.8  MCV 85.9  PLT 342   Cardiac Enzymes: No results for input(s): "CKTOTAL", "CKMB", "CKMBINDEX", "TROPONINI" in the last 168 hours.  BNP (last 3 results) No results for input(s): "PROBNP" in the last 8760 hours. CBG: No results for input(s): "GLUCAP" in the last 168 hours.  Urinalysis    Component Value Date/Time   COLORURINE AMBER ([name]) 10/12/2021 1359   APPEARANCEUR CLOUDY ([name]) 10/12/2021 1359   APPEARANCEUR Clear 06/11/2020 1007   LABSPEC 1.018 10/12/2021 1359   PHURINE 5.0 10/12/2021 1359   GLUCOSEU 50 ([name]) 10/12/2021 1359   HGBUR MODERATE ([name]) 10/12/2021 1359   BILIRUBINUR NEGATIVE 10/12/2021 1359   BILIRUBINUR Negative 06/11/2020 1007   KETONESUR 5 ([name]) 10/12/2021 1359   PROTEINUR 100 ([name]) 10/12/2021 1359   NITRITE NEGATIVE 10/12/2021 1359   LEUKOCYTESUR MODERATE ([name]) 10/12/2021 1359      Radiographic Studies: No results found.  EKG: Independently reviewed by me showed sinus tachycardia, no acute ST-T changes.    Assessment/Plan:   Principal Problem:   Sepsis (HCC) Active Problems:   Acute UTI   Acute urinary retention   Generalized weakness   Body mass index is 16.38 kg/m.  (Underweight)  Sepsis secondary to acute UTI: Admit to MedSurg.  He  was given 1 L of Ringer's lactate and IV ceftriaxone in the ED.  Continue IV ceftriaxone for UTI and sepsis.  Follow-up urine cultures.  Acute urinary retention, elevated PSA: Bladder scan showed greater than 900 mL of urine and Foley catheter was placed in the emergency room.  Restart Flomax.  He has declined biopsy for elevated PSA per Dr. 12/12/2021 note from 10/01/2021.  Generalized weakness: This is likely from sepsis/UTI: PT evaluation.  Dysphagia: Patient has been on liquid diet at home.  Consult speech therapist for swallow evaluation.      Other information:   DVT prophylaxis: enoxaparin (  LOVENOX) injection 40 mg Start: 10/13/21 1000Lovenox  Code Status: DNR.  This was confirmed by his wife who said  patient has [name] living will that says he is DNR. Family Communication: Plan discussed with his wife Disposition Plan: Plan to discharge home in 2 to 3 days Consults called: None Admission status: Inpatient  The medical decision making on this patient was of high complexity and the patient is at high risk for clinical deterioration, therefore this is [name] level 3 visit.      Aspen Deterding [facility] Pager: Please check [email or web address]   How to contact the [facility] Attending or Consulting provider 7A - 7P or covering provider during after hours 7P -7A, for this patient?   Check the care team in [facility] and look for [name]) attending/consulting [facility] provider listed and b) the [facility] team listed Log into [email or web address] and use [facility] universal password to access. If you do not have the password, please contact the hospital operator. Locate the [facility] provider you are looking for under [facility] and page to [name] number that you can be directly reached. If you still have difficulty reaching the provider, please page the [facility] (Director on Call) for the Hospitalists listed on amion for assistance.  10/12/2021, 4:25 PM

## 2021-10-12 NOTE — ED Provider Notes (Signed)
[facility] Provider Note    Event Date/Time   First MD Initiated Contact with Patient 10/12/21 1216     (approximate)   History   Chief Complaint Weakness   HPI  Paul Weckwerth. is a 86 y.o. male with past medical history of hypertension and GERD who presents to the ED for generalized weakness.  Majority of history is obtained from patient's wife, who states that he has been increasingly generally weak for the past 2 to 3 days.  She reports that he has had difficulty urinating for the past 2 days, during which time he has been unable to empty his bladder.  He states that he has been able to urinate small amounts up until last night, when he began making urine but was unable to voluntarily urinate.  He has continued to eat and drink, but states he has been on a liquid diet for the past 3 weeks due to difficulty swallowing solids.  Wife states that whenever he tries to eat something solid, it seems to stop esophagus can come back up.  He has been referred to see GI by his PCP, but they state they were unable to get an appointment until November.  Additionally, he has been following with urology for uptrending PSA levels, has declined biopsy on multiple visits.     Physical Exam   Triage Vital Signs: ED Triage Vitals  Enc Vitals Group     BP 10/12/21 0947 118/78     Pulse Rate 10/12/21 0947 (!) 101     Resp 10/12/21 0947 18     Temp 10/12/21 0947 98.6 F (37 C)     Temp Source 10/12/21 0947 Oral     SpO2 10/12/21 0947 98 %     Weight 10/12/21 0953 110 lb 14.3 oz (50.3 kg)     Height 10/12/21 0953 5\' 9"  (1.753 m)     Head Circumference --      Peak Flow --      Pain Score 10/12/21 0953 0     Pain Loc --      Pain Edu? --      Excl. in GC? --     Most recent vital signs: Vitals:   10/12/21 0947  BP: 118/78  Pulse: (!) 101  Resp: 18  Temp: 98.6 F (37 C)  SpO2: 98%    Constitutional: Alert and oriented, thin appearing. Eyes: Conjunctivae  are normal. Head: Atraumatic. Nose: No congestion/rhinnorhea. Mouth/Throat: Mucous membranes are moist.  Cardiovascular: Normal rate, regular rhythm. Grossly normal heart sounds.  2+ radial pulses bilaterally. Respiratory: Normal respiratory effort.  No retractions. Lungs CTAB. Gastrointestinal: Soft and tender to palpation in suprapubic area with mild associated distention. No distention. Musculoskeletal: No lower extremity tenderness nor edema.  Neurologic:  Normal speech and language. No gross focal neurologic deficits are appreciated.    ED Results / Procedures / Treatments   Labs (all labs ordered are listed, but only abnormal results are displayed) Labs Reviewed  BASIC METABOLIC PANEL - Abnormal; Notable for the following components:      Result Value   Glucose, Bld 122 (*)    All other components within normal limits  CBC - Abnormal; Notable for the following components:   WBC 19.6 (*)    All other components within normal limits  URINALYSIS, ROUTINE W REFLEX MICROSCOPIC - Abnormal; Notable for the following components:   Color, Urine AMBER (*)    APPearance CLOUDY (*)  Glucose, UA 50 (*)    Hgb urine dipstick MODERATE (*)    Ketones, ur 5 (*)    Protein, ur 100 (*)    Leukocytes,Ua MODERATE (*)    WBC, UA >50 (*)    Bacteria, UA MANY (*)    All other components within normal limits  HEPATIC FUNCTION PANEL - Abnormal; Notable for the following components:   Albumin 3.1 (*)    All other components within normal limits  URINE CULTURE  CULTURE, BLOOD (ROUTINE X 2)  CULTURE, BLOOD (ROUTINE X 2)  LIPASE, BLOOD  LACTIC ACID, PLASMA  LACTIC ACID, PLASMA  CBG MONITORING, ED     EKG  ED ECG REPORT I, Chesley Noon, the attending physician, personally viewed and interpreted this ECG.   Date: 10/12/2021  EKG Time: 9:56  Rate: 111  Rhythm: sinus tachycardia  Axis: Normal  Intervals:none  ST&T Change: None  PROCEDURES:  Critical Care performed:  No  Procedures   MEDICATIONS ORDERED IN ED: Medications  cefTRIAXone (ROCEPHIN) 1 g in sodium chloride 0.9 % 100 mL IVPB (has no administration in time range)  lactated ringers bolus 1,000 mL (has no administration in time range)     IMPRESSION / MDM / ASSESSMENT AND PLAN / ED COURSE  I reviewed the triage vital signs and the nursing notes.                              86 y.o. male with past medical history presents to the ED complaining seen for IV antibiotic with urinating for the past just like in 2 days associated with worsening generalized weakness.  Patient's presentation is most consistent with acute presentation with potential threat to life or bodily function.  Differential diagnosis includes, but is not limited to, sepsis, UTI, pyelonephritis, urinary retention, AKI, electrolyte abnormality.  Patient thin appearing but non-toxic, vital signs remarkable for tachycardia but otherwise reassuring.  Bladder scan showed greater than 900 cc  of urine, patient with apparent urinary retention with overflow incontinence. Foley catheter was placed with improvement in abdominal discomfort, however patient continues to be generally weak. UA concerning for UTI, we will send for culture and start patient on rocephin.  Labs remarkable for leukocytosis, no significant electrolyte abnormality or AKI noted, LFTs are unremarkable.  With tachycardia and leukocytosis, patient meets criteria for sepsis, he would benefit from admission for IV antibiotics given his significant weakness.  Case discussed with hospitalist for admission.      FINAL CLINICAL IMPRESSION(S) / ED DIAGNOSES   Final diagnoses:  Urinary retention  Acute cystitis without hematuria  Sepsis without acute organ dysfunction, due to unspecified organism [facility])     Rx / DC Orders   ED Discharge Orders     None        Note:  This document was prepared using Dragon voice recognition software and may include unintentional  dictation errors.   Chesley Noon, MD 10/12/21 [phone]

## 2021-10-12 NOTE — ED Notes (Signed)
Pt doing well,  NAD breathing e/u on room air.

## 2021-10-13 DIAGNOSIS — N390 Urinary tract infection, site not specified: Secondary | ICD-10-CM | POA: Diagnosis not present

## 2021-10-13 DIAGNOSIS — A419 Sepsis, unspecified organism: Secondary | ICD-10-CM | POA: Diagnosis not present

## 2021-10-13 DIAGNOSIS — R338 Other retention of urine: Secondary | ICD-10-CM | POA: Diagnosis not present

## 2021-10-13 LAB — BASIC METABOLIC PANEL
Anion gap: 3 — ABNORMAL LOW (ref 5–15)
BUN: 19 mg/dL (ref 8–23)
CO2: 27 mmol/L (ref 22–32)
Calcium: 8.9 mg/dL (ref 8.9–10.3)
Chloride: 106 mmol/L (ref 98–111)
Creatinine, Ser: 0.68 mg/dL (ref 0.61–1.24)
GFR, Estimated: >60 mL/min (ref >60)
Glucose, Bld: 84 mg/dL (ref 70–99)
Potassium: 3.1 mmol/L — ABNORMAL LOW (ref 3.5–5.1)
Sodium: 136 mmol/L (ref 135–145)

## 2021-10-13 LAB — CBC
HCT: 33.1 % — ABNORMAL LOW (ref 39.0–52.0)
Hemoglobin: 10.9 g/dL — ABNORMAL LOW (ref 13.0–17.0)
MCH: 28.2 pg (ref 26.0–34.0)
MCHC: 32.9 g/dL (ref 30.0–36.0)
MCV: 85.5 fL (ref 80.0–100.0)
Platelets: 239 10*3/uL (ref 150–400)
RBC: 3.87 MIL/uL — ABNORMAL LOW (ref 4.22–5.81)
RDW: 15.1 % (ref 11.5–15.5)
WBC: 10.4 10*3/uL (ref 4.0–10.5)
nRBC: 0 % (ref 0.0–0.2)

## 2021-10-13 MED ORDER — LACTATED RINGERS IV SOLN: INTRAVENOUS | Status: AC

## 2021-10-13 MED ORDER — POTASSIUM CHLORIDE CRYS ER 20 MEQ PO TBCR
40.0000 meq | EXTENDED_RELEASE_TABLET | Freq: Once | ORAL | Status: AC
Start: 2021-10-13 — End: 2021-10-13
  Administered 2021-10-13: 40 meq via ORAL
  Filled 2021-10-13: qty 2

## 2021-10-13 MED ORDER — SODIUM CHLORIDE 0.9 % IV SOLN: INTRAVENOUS | Status: DC | PRN

## 2021-10-13 MED ORDER — ADULT MULTIVITAMIN W/MINERALS CH
1.0000 | ORAL_TABLET | Freq: Every day | ORAL | Status: DC
  Administered 2021-10-14: 1 via ORAL
  Filled 2021-10-13: qty 1

## 2021-10-13 MED ORDER — ENSURE ENLIVE PO LIQD
237.0000 mL | Freq: Three times a day (TID) | ORAL | Status: DC
  Administered 2021-10-13 – 2021-10-14 (×3): 237 mL via ORAL

## 2021-10-13 MED ORDER — PANTOPRAZOLE SODIUM 40 MG PO TBEC
40.0000 mg | DELAYED_RELEASE_TABLET | Freq: Every day | ORAL | Status: DC
  Administered 2021-10-13 – 2021-10-14 (×2): 40 mg via ORAL
  Filled 2021-10-13 (×2): qty 1

## 2021-10-13 NOTE — Evaluation (Signed)
Physical Therapy Evaluation Patient Details Name: Paul Orsak Eguia Jr. MRN: [id number] DOB: May 05, 1935 Today's Date: 10/13/2021  History of Present Illness  Pt admitted for sepsis with complaints of weakness. History includes HTN, GERD, ED, and GERD.  Clinical Impression  Pt is a pleasant 86 year old male who was admitted for sepsis with complaints of weakness. Pt performs transfers with mod I and ambulation with supervision and RW. Pt demonstrates deficits with endurance/balance. Pt typically ambulates indep, however is reliant on RW at this time. Encouraged to continue to use RW for home mobility. Would benefit from skilled PT to address above deficits and promote optimal return to PLOF. Recommend transition to HHPT upon discharge from acute hospitalization.      Recommendations for follow up therapy are one component of a multi-disciplinary discharge planning process, led by the attending physician.  Recommendations may be updated based on patient status, additional functional criteria and insurance authorization.  Follow Up Recommendations Home health PT      Assistance Recommended at Discharge PRN  Patient can return home with the following  A little help with walking and/or transfers    Equipment Recommendations Rolling walker (2 wheels)  Recommendations for Other Services       Functional Status Assessment Patient has had a recent decline in their functional status and demonstrates the ability to make significant improvements in function in a reasonable and predictable amount of time.     Precautions / Restrictions Precautions Precautions: Fall Restrictions Weight Bearing Restrictions: No      Mobility  Bed Mobility               General bed mobility comments: received ambulating in hallway with mobility specialist. NT    Transfers Overall transfer level: Modified independent Equipment used: Rolling walker (2 wheels)               General transfer  comment: safe technique with cues for sequencing    Ambulation/Gait Ambulation/Gait assistance: Supervision Gait Distance (Feet): 200 Feet Assistive device: Rolling walker (2 wheels) Gait Pattern/deviations: Step-through pattern       General Gait Details: short step length and cautious steps. RW used, however when encouraged to go without AD, pt hesitant and reports he feels stable with RW. Received while ambulating in hallway with MS.  Stairs            Wheelchair Mobility    Modified Rankin (Stroke Patients Only)       Balance Overall balance assessment: Mild deficits observed, not formally tested                                           Pertinent Vitals/Pain Pain Assessment Pain Assessment: No/denies pain    Home Living Family/patient expects to be discharged to:: Private residence Living Arrangements: Spouse/significant other;Children Available Help at Discharge: Family Type of Home: House Home Access: Stairs to enter Entrance Stairs-Rails: Can reach both Entrance Stairs-Number of Steps: 2   Home Layout: One level Home Equipment: None      Prior Function Prior Level of Function : Independent/Modified Independent             Mobility Comments: per family, pt very active and no falls history. ADLs Comments: indep with all ADLs     Hand Dominance        Extremity/Trunk Assessment   Upper Extremity Assessment Upper Extremity Assessment:  Overall [facility] for tasks assessed    Lower Extremity Assessment Lower Extremity Assessment: Overall WFL for tasks assessed       Communication   Communication: No difficulties  Cognition Arousal/Alertness: Awake/alert Behavior During Therapy: WFL for tasks assessed/performed Overall Cognitive Status: Within Functional Limits for tasks assessed                                 General Comments: pleasant and eager to mobilize        General Comments      Exercises      Assessment/Plan    PT Assessment Patient needs continued PT services  PT Problem List Decreased activity tolerance;Decreased balance;Decreased mobility       PT Treatment Interventions Gait training;Therapeutic activities;Therapeutic exercise;Balance training    PT Goals (Current goals can be found in the Care Plan section)  Acute Rehab PT Goals Patient Stated Goal: to go home PT Goal Formulation: With patient Time For Goal Achievement: 10/27/21 Potential to Achieve Goals: Good    Frequency Min 2X/week     Co-evaluation               AM-PAC PT "6 Clicks" Mobility  Outcome Measure Help needed turning from your back to your side while in a flat bed without using bedrails?: None Help needed moving from lying on your back to sitting on the side of a flat bed without using bedrails?: None Help needed moving to and from a bed to a chair (including a wheelchair)?: None Help needed standing up from a chair using your arms (e.g., wheelchair or bedside chair)?: A Little Help needed to walk in hospital room?: A Little Help needed climbing 3-5 steps with a railing? : A Little 6 Click Score: 21    End of Session Equipment Utilized During Treatment: Gait belt Activity Tolerance: Patient tolerated treatment well Patient left: in chair;with chair alarm set;with family/visitor present Nurse Communication: Mobility status PT Visit Diagnosis: Unsteadiness on feet (R26.81);Difficulty in walking, not elsewhere classified (R26.2)    Time: 7035-0093 PT Time Calculation (min) (ACUTE ONLY): 12 min   Charges:   PT Evaluation $PT Eval Low Complexity: 1 Low          Elizabeth Palau, PT, DPT, GCS [phone]   Soraiya Ahner 10/13/2021, 3:03 PM

## 2021-10-13 NOTE — Progress Notes (Signed)
PROGRESS NOTE    Paul A Peretti Jr.  IRW:[id number] DOB: 12/25/1935 DOA: 10/12/2021 PCP: Leanna Sato, MD    Brief Narrative:  86 y.o. male past medical history significant for hypertension, GERD, elevated PSA, prediabetes, erectile dysfunction, GERD, enlarged prostate, who presented to the hospital because of generalized weakness and difficulty urinating for about 2 days.  He has been seen the urologist for elevated PSA (14.6) but he has declined biopsy.  He has been on liquid diet because of trouble swallowing solids.   Assessment & Plan:   Principal Problem:   Sepsis (HCC) Active Problems:   Acute UTI   Acute urinary retention   Generalized weakness  Sepsis secondary to acute UTI Urinary retention Elevated PSA Patient endorsed 2 to 3 days since last ability to void.  Patient has elevated PSA and is recommended prostate biopsy by urology.  He is declined as of last urology note from 7/28.  Foley catheter placed in ED.  Draining clear urine.  Greater than 900 cc retention it has been noted.  Urine appears infected. Plan: Restart IV fluids LR at 75 cc/h x 24 hours Continue IV ceftriaxone Follow urine culture and sensitivities Daily Flomax Continue Foley catheter Will likely discharge with Foley catheter in place and outpatient urology follow-up in 7 days to once again discuss prostate biopsy and consider voiding trial Anticipated date of discharge 8/1   Generalized weakness Likely secondary to sepsis/UTI Request therapy evaluations  Dysphagia  Patient has been on liquid diet at home.   Consult speech therapist for swallow evaluation.  Underweight BMI 16.38 RD consultation   DVT prophylaxis: SQ Lovenox Code Status: DNR Family Communication: Spouse at bedside 8/9 Disposition Plan: Status is: Inpatient Remains inpatient appropriate because: Sepsis secondary to UTI in setting of urinary retention.  Foley catheter in place.  Clinically improved.  Anticipate discharge  8/10.   Level of care: Med-Surg  Consultants:  None  Procedures:  Foley catheter insertion 8/8  Antimicrobials: Ceftriaxone   Subjective: Seen and examined.  Resting comfortably bed.  No visible distress.  No complaints of pain.  Objective: Vitals:   10/12/21 1627 10/12/21 2052 10/13/21 0435 10/13/21 0756  BP: 125/64 (!) 102/54 109/62 106/63  Pulse: 73 94 76 69  Resp: 18 16 16 18   Temp: 98.1 F (36.7 C) 99.3 F (37.4 C) 97.9 F (36.6 C) 98.3 F (36.8 C)  TempSrc: Oral Oral Oral   SpO2: 100% 95% 98% 99%  Weight:      Height:        Intake/Output Summary (Last 24 hours) at 10/13/2021 1153 Last data filed at 10/13/2021 1037 Gross per 24 hour  Intake 1279.2 ml  Output 1250 ml  Net 29.2 ml   Filed Weights   10/12/21 0953  Weight: 50.3 kg    Examination:  General exam: No acute distress.  Frail-appearing Respiratory system: Lungs clear.  Normal work of breathing.  Room air Cardiovascular system: S1-S2, RRR, no murmurs, no pedal edema Gastrointestinal system: Thin, soft, NT/ND, normal bowel sounds GU: Foley in place, draining clear urine Central nervous system: Alert and oriented. No focal neurological deficits. Extremities: Symmetric 5 x 5 power. Skin: No rashes, lesions or ulcers Psychiatry: Judgement and insight appear normal. Mood & affect appropriate.     Data Reviewed: I have personally reviewed following labs and imaging studies  CBC: Recent Labs  Lab 10/12/21 0955 10/13/21 0532  WBC 19.6* 10.4  HGB 13.2 10.9*  HCT 40.8 33.1*  MCV 85.9 85.5  PLT 342 239   Basic Metabolic Panel: Recent Labs  Lab 10/12/21 0955 10/13/21 0532  NA 138 136  K 3.6 3.1*  CL 104 106  CO2 23 27  GLUCOSE 122* 84  BUN 18 19  CREATININE 0.91 0.68  CALCIUM 9.9 8.9   GFR: Estimated Creatinine Clearance: 48.9 mL/min (by C-G formula based on SCr of 0.68 mg/dL). Liver Function Tests: Recent Labs  Lab 10/12/21 0955  AST 36  ALT 14  ALKPHOS 67  BILITOT 0.8   PROT 7.6  ALBUMIN 3.1*   Recent Labs  Lab 10/12/21 0955  LIPASE 29   No results for input(s): "AMMONIA" in the last 168 hours. Coagulation Profile: No results for input(s): "INR", "PROTIME" in the last 168 hours. Cardiac Enzymes: No results for input(s): "CKTOTAL", "CKMB", "CKMBINDEX", "TROPONINI" in the last 168 hours. BNP (last 3 results) No results for input(s): "PROBNP" in the last 8760 hours. HbA1C: No results for input(s): "HGBA1C" in the last 72 hours. CBG: No results for input(s): "GLUCAP" in the last 168 hours. Lipid Profile: No results for input(s): "CHOL", "HDL", "LDLCALC", "TRIG", "CHOLHDL", "LDLDIRECT" in the last 72 hours. Thyroid Function Tests: No results for input(s): "TSH", "T4TOTAL", "FREET4", "T3FREE", "THYROIDAB" in the last 72 hours. Anemia Panel: No results for input(s): "VITAMINB12", "FOLATE", "FERRITIN", "TIBC", "IRON", "RETICCTPCT" in the last 72 hours. Sepsis Labs: Recent Labs  Lab 10/12/21 1532 10/12/21 1835  LATICACIDVEN 1.8 1.0    Recent Results (from the past 240 hour(s))  Culture, blood (routine x 2)     Status: None (Preliminary result)   Collection Time: 10/12/21  3:32 PM   Specimen: BLOOD  Result Value Ref Range Status   Specimen Description BLOOD BLOOD LEFT FOREARM  Final   Special Requests   Final    BOTTLES DRAWN AEROBIC AND ANAEROBIC Blood Culture results may not be optimal due to an inadequate volume of blood received in culture bottles   Culture   Final    NO GROWTH < 24 HOURS Performed at [facility], [address], [place], [place] [address]    Report Status PENDING  Incomplete  Culture, blood (routine x 2)     Status: None (Preliminary result)   Collection Time: 10/12/21  3:32 PM   Specimen: BLOOD  Result Value Ref Range Status   Specimen Description BLOOD BLOOD RIGHT FOREARM  Final   Special Requests   Final    BOTTLES DRAWN AEROBIC AND ANAEROBIC Blood Culture results may not be optimal due to an inadequate  volume of blood received in culture bottles   Culture   Final    NO GROWTH < 24 HOURS Performed at [facility], [address], [place], [place] [address]    Report Status PENDING  Incomplete         Radiology Studies: No results found.      Scheduled Meds:  enoxaparin (LOVENOX) injection  40 mg Subcutaneous Q24H   Continuous Infusions:  sodium chloride     cefTRIAXone (ROCEPHIN)  IV 1 g (10/13/21 0814)   lactated ringers 75 mL/hr at 10/13/21 1054     LOS: 1 day       Tresa Moore, MD [facility]   If 7PM-7AM, please contact night-coverage  10/13/2021, 11:53 AM

## 2021-10-13 NOTE — Progress Notes (Signed)
Wife given supplies needed to care for the patients foley at home. Wife and patient instructed on how to care for foley at discharge. Teaching needs to be reinforced

## 2021-10-13 NOTE — Evaluation (Signed)
Clinical/Bedside Swallow Evaluation Patient Details  Name: Paul [name]. MRN: [id number] Date of Birth: 03/20/1935  Today's Date: 10/13/2021 Time: SLP Start Time (ACUTE ONLY): 1400 SLP Stop Time (ACUTE ONLY): 1500 SLP Time Calculation (min) (ACUTE ONLY): 60 min  Past Medical History:  Past Medical History:  Diagnosis Date   ED (erectile dysfunction)    GERD (gastroesophageal reflux disease)    Hypertension    Prediabetes    Past Surgical History:  Past Surgical History:  Procedure Laterality Date   COLONOSCOPY, ESOPHAGOGASTRODUODENOSCOPY (EGD) AND ESOPHAGEAL DILATION     HPI:  Pt is a 86 y.o. male past medical history significant for hypertension, GERD, elevated PSA, prediabetes, erectile dysfunction, GERD, esophageal dilation, enlarged prostate, who presented to the hospital because of generalized weakness and difficulty urinating for about 2 days.  He has been seen the urologist for elevated PSA (14.6) but he has declined biopsy.  He has been on liquid diet because of trouble swallowing solids.  Pt and Wife endorse s/s of REFLUX at home -- pt is not on a PPI at baseline.   CXR: The findings may indicate low-grade CHF with mild   interstitial edema. I see no focal pneumonia but early interstitial   pneumonia cannot be absolutely excluded.   CT of Abd.: There is an abnormal appearance of the lower esophagus. On a CT scan from June 2012 a somewhat similar appearance was demonstrated the there may be   slightly more wall thickening currently. This could indicate acute   inflammation. There may be obstruction at the GE junction given the   patient's vomiting. Consideration could be given for performing CT scanning of the chest.    Assessment / Plan / Recommendation  Clinical Impression   Pt seen for BSE today. Wife present in room. Both pt and Wife endorsed pt having s/s of REFLUX at home w/ worsening s/s of difficulty tolerating solids. Noted changes of the Lower Esophagus per CT Abd  scan(see chart). Pt is also Edentulous -- does not wear Dentures when eating at home. (This can impact effective mastication w/ solids for swallowing)   Pt appears to present w/ adequate oropharyngeal phase swallowing function w/ No overt oropharyngeal phase dysphagia appreciated during po trials; No neuromuscular swallowing deficits appreciated. Pt is at reduced risk for aspiration from an oropharyngeal phase standpoint following general aspiration precautions.   Pt has a baseline of GERD/REFLUX and is Not on a PPI at home. He has had a previous Esophageal Dilation per Wife/chart. ANY Esophageal Dysmotility or Regurgitation of Reflux material can increase risk for aspiration of the Reflux material during Retrograde flow thus impact Voicing and Pulmonary status. It can also impact comfort of swallowing, especially solids.   Pt described ongoing issues and that the discomfort "worsened" in the past week -- stated he felt this has impacted his ability to take meals and has only been eating mashed potatoes, soup, and thin liquids. Pt is also Edentulous which impacts effective mastication of solids.  During this assessment, he consumed trials of thin liquids, (room temp best/rec'd w/ Esophageal dysmotility), then tsps of puree and minced/moistened solids w/ no overt clinical s/s of aspiration noted; clear vocal quality b/t trials, no decline in pulmonary status, no cough, no multiple swallows noted post initial pharyngeal swallow. Oral phase appeared [facility] for bolus management, mashing, and timely A-P transfer/clearing of material. OM exam was [facility] for oral clearing; lingual/labial movements. No unilateral weakness. Speech clear.  Of Note, pt demonstrated No s/s of  Esophageal dysmotility/irritation; no globus feelings during the oral intake. Education was given on alternating foods/liquids; small bites/sips.    MD has implemented a PPI per chart orders. Recommend a more minced/chopped diet of foods currently  (suspect pt can upgrade back to his normal diet as he feels comfortable w/ PPI/REFLUX management); Thin liquids. Recommend manageable foods as tolerates d/t the Esophageal Dysmotility and Edentulous status(w/ less meats/breads in diet, moistened foods chopped well). General aspiration precautions. Rest Breaks during meals/oral intake to allow for Esophageal clearing. GERD/REFLUX precautions strongly recommended to lessen chance for Regurgitation. Recommend pt continue f/u w/ GI for management of Reflux and tx as indicated. Pills Whole in Puree for ease of swallowing. Discussion/education and handouts given to pt/Wife. MD to reconsult ST services if any new needs while admitted. NSG updated. All agreed. No further ST services.  SLP Visit Diagnosis: Dysphagia, unspecified (R13.10)    Aspiration Risk   (reduced following general REFLUX and aspiration precautions)    Diet Recommendation   a more minced/chopped diet of foods currently (suspect pt can upgrade back to his normal diet as he feels comfortable w/ PPI/REFLUX management); Thin liquids. Recommend manageable foods as tolerates d/t the Esophageal Dysmotility and Edentulous status(w/ less meats/breads in diet, moistened foods chopped well). General aspiration precautions. Rest Breaks during meals/oral intake to allow for Esophageal clearing. GERD/REFLUX precautions strongly recommended to lessen chance for Regurgitation.  Medication Administration: Whole meds with puree    Other  Recommendations Recommended Consults: Consider GI evaluation;Consider esophageal assessment (Wife stated appt scheduled Nov 2023) Oral Care Recommendations: Oral care BID;Oral care before and after PO;Patient independent with oral care Other Recommendations:  (n/a)    Recommendations for follow up therapy are one component of a multi-disciplinary discharge planning process, led by the attending physician.  Recommendations may be updated based on patient status, additional  functional criteria and insurance authorization.  Follow up Recommendations No SLP follow up      Assistance Recommended at Discharge None  Functional Status Assessment Patient has had a recent decline in their functional status and demonstrates the ability to make significant improvements in function in a reasonable and predictable amount of time.  Frequency and Duration  (n/a)   (n/a)       Prognosis Prognosis for Safe Diet Advancement: Fair (-Good) Barriers to Reach Goals: Time post onset;Severity of deficits Barriers/Prognosis Comment: Esophageal dysmotility; REFLUX      Swallow Study   General Date of Onset: 10/12/21 HPI: Pt is a 86 y.o. male past medical history significant for hypertension, GERD, elevated PSA, prediabetes, erectile dysfunction, GERD, enlarged prostate, who presented to the hospital because of generalized weakness and difficulty urinating for about 2 days.  He has been seen the urologist for elevated PSA (14.6) but he has declined biopsy.  He has been on liquid diet because of trouble swallowing solids.  Pt and Wife endorse s/s of REFLUX at home -- pt is not on a PPI at baseline.   CXR: The findings may indicate low-grade CHF with mild   interstitial edema. I see no focal pneumonia but early interstitial   pneumonia cannot be absolutely excluded.   CT of Abd.: There is an abnormal appearance of the lower esophagus. On a CT scan from June 2012 a somewhat similar appearance was demonstrated the there may be   slightly more wall thickening currently. This could indicate acute   inflammation. There may be obstruction at the GE junction given the   patient's vomiting. Consideration could be  given for performing CT scanning of the chest. Type of Study: Bedside Swallow Evaluation Previous Swallow Assessment: none Diet Prior to this Study: Thin liquids (Full Liquids diet per MD at admit) Temperature Spikes Noted: No (wbc 10.4) Respiratory Status: Room air History of Recent  Intubation: No Behavior/Cognition: Alert;Cooperative;Pleasant mood Oral Cavity Assessment: Within Functional Limits Oral Care Completed by SLP: Recent completion by staff Oral Cavity - Dentition: Edentulous (pt does not wear Dentures) Vision: Functional for self-feeding Self-Feeding Abilities: Able to feed self;Needs set up Patient Positioning: Upright in bed Baseline Vocal Quality: Normal Volitional Cough: Strong Volitional Swallow: Able to elicit    Oral/Motor/Sensory Function Overall Oral Motor/Sensory Function: Within functional limits   Ice Chips Ice chips: Not tested   Thin Liquid Thin Liquid: Within functional limits Presentation: Self Fed;Straw (10+)    Nectar Thick Nectar Thick Liquid: Not tested   Honey Thick Honey Thick Liquid: Not tested   Puree Puree: Within functional limits Presentation: Self Fed;Spoon (7 trials)   Solid     Solid: Impaired (mild) Presentation: Self Fed;Spoon (4 trials) Oral Phase Impairments: Impaired mastication (edentulous) Oral Phase Functional Implications: Impaired mastication (edentulous baseline) Pharyngeal Phase Impairments:  (none)         Jerilynn Som, MS, CCC-SLP Speech Language Pathologist Rehab Services; [facility] [place] [phone] (ascom) Canisha Issac 10/13/2021,4:11 PM

## 2021-10-13 NOTE — Progress Notes (Signed)
Mobility Specialist - Progress Note     10/13/21 1049  Mobility  Activity Ambulated with assistance in hallway  Level of Assistance Contact guard assist, steadying assist  Assistive Device Front wheel walker  Distance Ambulated (ft) 180 ft  Activity Response Tolerated well  $Mobility charge 1 Mobility   Pt supine upon entry. Pt utilizing RA. Pt transferred to EOB and stood up min assist. Pt ambulated one lap around NS. Pt left in chair with alarm set and needs in reach. Physical therapist enter upon exit. No complaints.   Zetta Bills Mobility Specialist 10/13/21 10:52 AM

## 2021-10-14 DIAGNOSIS — R338 Other retention of urine: Secondary | ICD-10-CM | POA: Diagnosis not present

## 2021-10-14 DIAGNOSIS — R7303 Prediabetes: Secondary | ICD-10-CM | POA: Insufficient documentation

## 2021-10-14 DIAGNOSIS — N390 Urinary tract infection, site not specified: Secondary | ICD-10-CM | POA: Diagnosis not present

## 2021-10-14 DIAGNOSIS — Z466 Encounter for fitting and adjustment of urinary device: Secondary | ICD-10-CM | POA: Insufficient documentation

## 2021-10-14 DIAGNOSIS — K219 Gastro-esophageal reflux disease without esophagitis: Secondary | ICD-10-CM | POA: Insufficient documentation

## 2021-10-14 DIAGNOSIS — A419 Sepsis, unspecified organism: Secondary | ICD-10-CM | POA: Diagnosis not present

## 2021-10-14 DIAGNOSIS — R972 Elevated prostate specific antigen [PSA]: Secondary | ICD-10-CM | POA: Insufficient documentation

## 2021-10-14 DIAGNOSIS — R1310 Dysphagia, unspecified: Secondary | ICD-10-CM | POA: Insufficient documentation

## 2021-10-14 DIAGNOSIS — I10 Essential (primary) hypertension: Secondary | ICD-10-CM | POA: Insufficient documentation

## 2021-10-14 MED ORDER — CHLORHEXIDINE GLUCONATE CLOTH 2 % EX PADS
6.0000 | MEDICATED_PAD | Freq: Every day | CUTANEOUS | Status: DC
  Administered 2021-10-14: 6 via TOPICAL

## 2021-10-14 MED ORDER — TAMSULOSIN HCL 0.4 MG PO CAPS: 0.4000 mg | ORAL_CAPSULE | Freq: Every day | ORAL | 0 refills | Status: DC

## 2021-10-14 MED ORDER — TAMSULOSIN HCL 0.4 MG PO CAPS: 0.4000 mg | ORAL_CAPSULE | Freq: Every day | ORAL | Status: DC

## 2021-10-14 MED ORDER — PANTOPRAZOLE SODIUM 40 MG PO TBEC: 40.0000 mg | DELAYED_RELEASE_TABLET | Freq: Every day | ORAL | 0 refills | Status: AC

## 2021-10-14 MED ORDER — CIPROFLOXACIN HCL 500 MG PO TABS: 500.0000 mg | ORAL_TABLET | Freq: Two times a day (BID) | ORAL | 0 refills | Status: AC

## 2021-10-14 NOTE — Plan of Care (Signed)
  Problem: Elimination: Goal: Will not experience complications related to urinary retention Outcome: Not Progressing   Problem: Safety: Goal: Ability to remain free from injury will improve Outcome: Not Progressing   

## 2021-10-14 NOTE — Discharge Summary (Signed)
Physician Discharge Summary  Paul [name]. BJY:[id number] DOB: 19-Feb-1936 DOA: 10/12/2021  PCP: Leanna Sato, MD  Admit date: 10/12/2021 Discharge date: 10/14/2021  Admitted From: Home Disposition: Home with home health  Recommendations for Outpatient Follow-up:  Follow up with PCP in 1-2 weeks Follow-up with urology 1 week for voiding trial  Home Health: Yes PT OT RN Equipment/Devices: Foley with leg bag  Discharge Condition: Stable CODE STATUS: DNR Diet recommendation: Regular  Brief/Interim Summary: 86 y.o. male past medical history significant for hypertension, GERD, elevated PSA, prediabetes, erectile dysfunction, GERD, enlarged prostate, who presented to the hospital because of generalized weakness and difficulty urinating for about 2 days.  He has been seen the urologist for elevated PSA (14.6) but he has declined biopsy.  He has been on liquid diet because of trouble swallowing solids.  Maintained on IV ceftriaxone for the course of admission.  Despite the infected appearance with urinalysis urine culture at time of discharge showed no growth.  He is being reintubated for better growth however we will elect to transition to p.o. ciprofloxacin to complete a total 7-day antibiotic course.  Remainder of 5 days of ciprofloxacin prescribed.  Nightly Flomax prescribed.  Patient will follow-up in urology Dr. Heywood Footman office in 7 days for voiding trial.  I have notified Dr. Heywood Footman PA for disposition plan.  In regards to the patient's dysphagia he has been placed on a dysphagia 2 diet.  Chest CT did demonstrate thoracic esophageal dilatation.  Concern for possible achalasia versus chronic distal esophageal obstruction.  As patient is hemodynamically stable and is tolerating oral intake will defer endoluminal evaluation outpatient.  Ambulatory referral to GI placed at time of discharge.   Discharge Diagnoses:  Principal Problem:   Sepsis (HCC) Active Problems:   Acute UTI    Acute urinary retention   Generalized weakness  Sepsis secondary to acute UTI Urinary retention Elevated PSA Patient endorsed 2 to 3 days since last ability to void.  Patient has elevated PSA and is recommended prostate biopsy by urology.  He is declined as of last urology note from 7/28.  Foley catheter placed in ED.  Draining clear urine.  Greater than 900 cc retention it has been noted.  Urine appears infected. Plan: Urine culture showed no growth.  Reintubated for better growth.  DC IV ceftriaxone.  Transition to p.o. ciprofloxacin.  Complete additional 5 days for total 7-day antibiotic course.  Will prescribe daily Flomax at time of discharge.  Continue Foley catheter with leg bag.  Outpatient urology follow-up in 7 days for voiding trial.  Notified urology PA disposition plan.    Generalized weakness Likely secondary to sepsis/UTI Therapy evaluations Home health PT and OT  Dysphagia  Patient has been on liquid diet at home.   SLP consulted, patient on dysphagia 2 diet CT thorax demonstrated possible achalasia versus chronic distal esophageal obstruction.  Patient is tolerating p.o. at time of discharge.  Will discharge with ambulatory referral for GI for endoluminal evaluation.   Underweight BMI 16.38 RD consultation  Discharge Instructions  Discharge Instructions     Diet - low sodium heart healthy   Complete by: As directed    Increase activity slowly   Complete by: As directed       Allergies as of 10/14/2021   No Known Allergies      Medication List     STOP taking these medications    ZyrTEC Allergy 10 MG Caps Generic drug: Cetirizine HCl  TAKE these medications    amLODipine 10 MG tablet Commonly known as: NORVASC Take 1 tablet by mouth daily.   ciprofloxacin 500 MG tablet Commonly known as: Cipro Take 1 tablet (500 mg total) by mouth 2 (two) times daily for 5 days. Start taking on: October 15, 2021   Ensure Take by mouth.   Garlic 10  MG Caps Take by mouth.   pantoprazole 40 MG tablet Commonly known as: PROTONIX Take 1 tablet (40 mg total) by mouth daily. Start taking on: October 15, 2021   tamsulosin 0.4 MG Caps capsule Commonly known as: FLOMAX Take 1 capsule (0.4 mg total) by mouth daily after supper.               Durable Medical Equipment  (From admission, onward)           Start     Ordered   10/13/21 1506  For home use only DME Walker rolling  Once       Question Answer Comment  Walker: With 5 Inch Wheels   Patient needs a walker to treat with the following condition Weakness generalized      10/13/21 1506            Follow-up Information     Stoioff, Verna Czech, MD. Schedule an appointment as soon as possible for a visit in 1 week(s).   Specialty: Urology Why: Voiding trial Contact information: [address] [place] [address] [phone]                No Known Allergies  Consultations: None   Procedures/Studies: No results found.    Subjective: Seen and examined on day of discharge.  Stable no distress.  Stable for discharge home.  Discharge Exam: Vitals:   10/14/21 0358 10/14/21 0736  BP: (!) 103/59 112/63  Pulse: 70 74  Resp: 19 16  Temp: 98 F (36.7 C) 98.8 F (37.1 C)  SpO2: 97% 98%   Vitals:   10/13/21 1603 10/13/21 2124 10/14/21 0358 10/14/21 0736  BP: 109/63 (!) 100/54 (!) 103/59 112/63  Pulse: 74 72 70 74  Resp: 18 18 19 16   Temp: 98.7 F (37.1 C) 98.6 F (37 C) 98 F (36.7 C) 98.8 F (37.1 C)  TempSrc:  Oral  Oral  SpO2: 96% 96% 97% 98%  Weight:      Height:        General: Pt is alert, awake, not in acute distress Cardiovascular: RRR, S1/S2 +, no rubs, no gallops Respiratory: CTA bilaterally, no wheezing, no rhonchi Abdominal: Soft, NT, ND, bowel sounds + Extremities: no edema, no cyanosis    The results of significant diagnostics from this hospitalization (including imaging, microbiology, ancillary and  laboratory) are listed below for reference.     Microbiology: Recent Results (from the past 240 hour(s))  Urine Culture     Status: None (Preliminary result)   Collection Time: 10/12/21  1:59 PM   Specimen: Urine, Catheterized  Result Value Ref Range Status   Specimen Description   Final    URINE, CATHETERIZED Performed at [facility], [address], [place], [place]    Special Requests   Final    NONE Performed at [facility], [address], [place], [place]    Culture   Final    CULTURE REINCUBATED FOR BETTER GROWTH Performed at [facility], [address] [place], [place]    Report Status PENDING  Incomplete  Culture, blood (routine x 2)     Status: None (Preliminary result)   Collection Time: 10/12/21  3:32 PM   Specimen: BLOOD  Result Value Ref Range Status   Specimen Description BLOOD BLOOD LEFT FOREARM  Final   Special Requests   Final    BOTTLES DRAWN AEROBIC AND ANAEROBIC Blood Culture results may not be optimal due to an inadequate volume of blood received in culture bottles   Culture   Final    NO GROWTH 2 DAYS Performed at [facility], [address] [place], [place] [address]    Report Status PENDING  Incomplete  Culture, blood (routine x 2)     Status: None (Preliminary result)   Collection Time: 10/12/21  3:32 PM   Specimen: BLOOD  Result Value Ref Range Status   Specimen Description BLOOD BLOOD RIGHT FOREARM  Final   Special Requests   Final    BOTTLES DRAWN AEROBIC AND ANAEROBIC Blood Culture results may not be optimal due to an inadequate volume of blood received in culture bottles   Culture   Final    NO GROWTH 2 DAYS Performed at [facility], [address] [place], [place] [address]    Report Status PENDING  Incomplete     Labs: BNP (last 3 results) No results for input(s): "BNP" in the last 8760 hours. Basic Metabolic Panel: Recent Labs  Lab 10/12/21 0955  10/13/21 0532  NA 138 136  K 3.6 3.1*  CL 104 106  CO2 23 27  GLUCOSE 122* 84  BUN 18 19  CREATININE 0.91 0.68  CALCIUM 9.9 8.9   Liver Function Tests: Recent Labs  Lab 10/12/21 0955  AST 36  ALT 14  ALKPHOS 67  BILITOT 0.8  PROT 7.6  ALBUMIN 3.1*   Recent Labs  Lab 10/12/21 0955  LIPASE 29   No results for input(s): "AMMONIA" in the last 168 hours. CBC: Recent Labs  Lab 10/12/21 0955 10/13/21 0532  WBC 19.6* 10.4  HGB 13.2 10.9*  HCT 40.8 33.1*  MCV 85.9 85.5  PLT 342 239   Cardiac Enzymes: No results for input(s): "CKTOTAL", "CKMB", "CKMBINDEX", "TROPONINI" in the last 168 hours. BNP: Invalid input(s): "POCBNP" CBG: No results for input(s): "GLUCAP" in the last 168 hours. D-Dimer No results for input(s): "DDIMER" in the last 72 hours. Hgb A1c No results for input(s): "HGBA1C" in the last 72 hours. Lipid Profile No results for input(s): "CHOL", "HDL", "LDLCALC", "TRIG", "CHOLHDL", "LDLDIRECT" in the last 72 hours. Thyroid function studies No results for input(s): "TSH", "T4TOTAL", "T3FREE", "THYROIDAB" in the last 72 hours.  Invalid input(s): "FREET3" Anemia work up No results for input(s): "VITAMINB12", "FOLATE", "FERRITIN", "TIBC", "IRON", "RETICCTPCT" in the last 72 hours. Urinalysis    Component Value Date/Time   COLORURINE AMBER (A) 10/12/2021 1359   APPEARANCEUR CLOUDY (A) 10/12/2021 1359   APPEARANCEUR Clear 06/11/2020 1007   LABSPEC 1.018 10/12/2021 1359   PHURINE 5.0 10/12/2021 1359   GLUCOSEU 50 (A) 10/12/2021 1359   HGBUR MODERATE (A) 10/12/2021 1359   BILIRUBINUR NEGATIVE 10/12/2021 1359   BILIRUBINUR Negative 06/11/2020 1007   KETONESUR 5 (A) 10/12/2021 1359   PROTEINUR 100 (A) 10/12/2021 1359   NITRITE NEGATIVE 10/12/2021 1359   LEUKOCYTESUR MODERATE (A) 10/12/2021 1359   Sepsis Labs Recent Labs  Lab 10/12/21 0955 10/13/21 0532  WBC 19.6* 10.4   Microbiology Recent Results (from the past 240 hour(s))  Urine Culture      Status: None (Preliminary  result)   Collection Time: 10/12/21  1:59 PM   Specimen: Urine, Catheterized  Result Value Ref Range Status   Specimen Description   Final    URINE, CATHETERIZED Performed at [facility], [address], [place], [place] [address]    Special Requests   Final    NONE Performed at [facility], [address], [place], [place] [address]    Culture   Final    CULTURE REINCUBATED FOR BETTER GROWTH Performed at [facility], [place] [address] [place], [place] [address]    Report Status PENDING  Incomplete  Culture, blood (routine x 2)     Status: None (Preliminary result)   Collection Time: 10/12/21  3:32 PM   Specimen: BLOOD  Result Value Ref Range Status   Specimen Description BLOOD BLOOD LEFT FOREARM  Final   Special Requests   Final    BOTTLES DRAWN AEROBIC AND ANAEROBIC Blood Culture results may not be optimal due to an inadequate volume of blood received in culture bottles   Culture   Final    NO GROWTH 2 DAYS Performed at [facility], [address] [place], [place] [address]    Report Status PENDING  Incomplete  Culture, blood (routine x 2)     Status: None (Preliminary result)   Collection Time: 10/12/21  3:32 PM   Specimen: BLOOD  Result Value Ref Range Status   Specimen Description BLOOD BLOOD RIGHT FOREARM  Final   Special Requests   Final    BOTTLES DRAWN AEROBIC AND ANAEROBIC Blood Culture results may not be optimal due to an inadequate volume of blood received in culture bottles   Culture   Final    NO GROWTH 2 DAYS Performed at [facility], [address] [place], [place] [address]    Report Status PENDING  Incomplete     Time coordinating discharge: Over 30 minutes  SIGNED:   Sidney Ace, MD  [facility] 10/14/2021, 11:16 AM Pager   If 7PM-7AM, please contact night-coverage

## 2021-10-14 NOTE — Plan of Care (Signed)
  Problem: Activity: Goal: Risk for activity intolerance will decrease Outcome: Progressing   Problem: Nutrition: Goal: Adequate nutrition will be maintained Outcome: Progressing   Problem: Coping: Goal: Level of anxiety will decrease Outcome: Progressing   Problem: Pain Managment: Goal: General experience of comfort will improve Outcome: Progressing   Problem: Safety: Goal: Ability to remain free from injury will improve Outcome: Progressing   

## 2021-10-14 NOTE — Progress Notes (Signed)
Discharge instructions reviewed with patient and wife including catheter care, followup visits and new medications/medication changes.  Understanding was verbalized and all questions were answered.  IV removed without complication; patient tolerated well.  Catheter supplies provided to patient.  Patient discharged home via wheelchair in stable condition escorted by volunteer staff.

## 2021-10-14 NOTE — Progress Notes (Signed)
Mobility Specialist - Progress Note     10/14/21 0922  Mobility  Activity Ambulated with assistance in hallway  Level of Assistance Standby assist, set-up cues, supervision of patient - no hands on  Assistive Device Front wheel walker  Distance Ambulated (ft) 200 ft  Activity Response Tolerated well  $Mobility charge 1 Mobility   Pt supine upon entry. Pt utilizing RA. Pt transferred to EOB and stood with min assist. Pt ambulated one lap around NS. Pt left supine with alarm set and needs in reach. Family at bedside. No complaints.   Zetta Bills Mobility Specialist 10/14/21 9:29 AM

## 2021-10-14 NOTE — TOC Initial Note (Addendum)
Transition of Care (TOC) - Initial/Assessment Note    Patient Details  Name: Paul Sciulli Barse Jr. MRN: [id number] Date of Birth: 1935-09-21  Transition of Care [facility]) CM/SW Contact:    Paul Fitch, RN Phone Number: 10/14/2021, 9:52 AM  Clinical Narrative:                  Admitted for: Sepsis. To discharge with foley  Admitted from: home with wife who has at bedside and will transport at discharge [place]CP: [place] [facility] Current home health/prior home health/DME:NA  Therapy recommending home health and RW.  Patient and wife in agreement. Per wife no preference of agency.  Referral made and accept by Paul [name] with [facility]  RW to be delivered to room by [employer] prior to dc       Updated.  To discharge today RW delivered Paul [name] with [facility] notified   Patient Goals and CMS Choice        Expected Discharge Plan and Services                                                Prior Living Arrangements/Services                       Activities of Daily Living Home Assistive Devices/Equipment: None ADL Screening (condition at time of admission) Patient's cognitive ability adequate to safely complete daily activities?: No Is the patient deaf or have difficulty hearing?: No Does the patient have difficulty seeing, even when wearing glasses/contacts?: No Does the patient have difficulty concentrating, remembering, or making decisions?: No Patient able to express need for assistance with ADLs?: Yes Does the patient have difficulty dressing or bathing?: No Independently performs ADLs?: Yes (appropriate for developmental age) Does the patient have difficulty walking or climbing stairs?: No Weakness of Legs: None Weakness of Arms/Hands: None  Permission Sought/Granted                  Emotional Assessment              Admission diagnosis:  Urinary retention [R33.9] Acute cystitis without hematuria  [N30.00] Sepsis (HCC) [A41.9] Sepsis without acute organ dysfunction, due to unspecified organism [facility]) [A41.9] Patient Active Problem List   Diagnosis Date Noted   Sepsis (HCC) 10/12/2021   Acute UTI 10/12/2021   Acute urinary retention 10/12/2021   Generalized weakness 10/12/2021   PCP:  Leanna Sato, MD [facility]acy[facility]   [phone] [place] - [address] [place] [address] Phone: [phone] Fax: [phone]  [id number] [place] [facility] - [address] [place] [address] Phone: [phone] Fax: [phone]     Social Determinants of Health (SDOH) Interventions    Readmission Risk Interventions     No data to display

## 2021-10-15 LAB — URINE CULTURE: Culture: >=100000 — AB

## 2021-10-17 LAB — CULTURE, BLOOD (ROUTINE X 2)
Culture: NO GROWTH
Culture: NO GROWTH

## 2021-10-26 ENCOUNTER — Ambulatory Visit (INDEPENDENT_AMBULATORY_CARE_PROVIDER_SITE_OTHER): Payer: Medicare HMO | Admitting: Physician Assistant

## 2021-10-26 ENCOUNTER — Ambulatory Visit: Payer: Medicare HMO | Admitting: Physician Assistant

## 2021-10-26 DIAGNOSIS — R339 Retention of urine, unspecified: Secondary | ICD-10-CM | POA: Diagnosis not present

## 2021-10-26 MED ORDER — TAMSULOSIN HCL 0.4 MG PO CAPS: 0.4000 mg | ORAL_CAPSULE | Freq: Every day | ORAL | 11 refills | Status: DC

## 2021-10-26 NOTE — Patient Instructions (Signed)
Today we discussed that having an enlarged prostate is the leading cause of urinary retention. Other things that can cause urinary retention include having a urinary tract infection, which you were treated for in the hospital, and having prostate cancer, which you have declined a prostate biopsy to check for. Without a prostate biopsy, I cannot rule out prostate cancer as the cause of your urinary retention.  I'd like you to stay on the Flomax (tamsulosin) medication. I have refilled this for one year today. This is likely to be an "every day, forever" medication for you. I'll plan to see you back in clinic next year to make sure your urinary symptoms are stable and to represcribe this medication.  If you develop difficulty urinating similar to earlier this month when you were hospitalized, please call my office immediately or go to the Emergency Department for assistance if outside of office hours (8am-5pm Monday-Friday).

## 2021-10-26 NOTE — Progress Notes (Signed)
10/26/2021 8:36 AM   Paul Kohut Halle Jr. Dec 08, 1935 [id number]  CC: Chief Complaint  Patient presents with   Urinary Retention   HPI: Paul [name]. is a 86 y.o. male with BPH on Flomax, PMH elevated PSA who has declined prostate biopsy recently admitted with urinary retention and sepsis due to UTI who presents today for outpatient voiding trial.  He is accompanied today by his wife, who contributes to HPI.  He was admitted from 10/12/2021 to 10/14/2021 with reports of 2 days of generalized weakness and difficulty urinating.  A Foley catheter was placed in the ED with return of 900 cc of urine.  Admission UA appeared grossly positive with >50 WBCs/hpf, 11-20 RBCs/hpf, and many bacteria; urine culture finalized with pansensitive Staph epidermidis.  Blood cultures negative.  He was treated with IV ceftriaxone and discharged on Cipro.  He received a total of 7 days of Cipro.  Today he reports completing Cipro approximately 2 days ago.  He was prescribed Flomax upon discharge from the hospital and is tolerating it without dizziness or lightheadedness with standing.  They state he had new urinary frequency and difficulty voiding in the 2 days prior to presenting to the emergency department earlier this month.  They report living in [facility] [facility] and decline a return visit this afternoon for follow-up PVR.  PMH: Past Medical History:  Diagnosis Date   ED (erectile dysfunction)    GERD (gastroesophageal reflux disease)    Hypertension    Prediabetes     Surgical History: Past Surgical History:  Procedure Laterality Date   COLONOSCOPY, ESOPHAGOGASTRODUODENOSCOPY (EGD) AND ESOPHAGEAL DILATION      Home Medications:  Allergies as of 10/26/2021   No Known Allergies      Medication List        Accurate as of October 26, 2021  8:36 AM. If you have any questions, ask your nurse or doctor.          amLODipine 10 MG tablet Commonly known as: NORVASC Take 1 tablet by mouth  daily.   Ensure Take by mouth.   Garlic 10 MG Caps Take by mouth.   pantoprazole 40 MG tablet Commonly known as: PROTONIX Take 1 tablet (40 mg total) by mouth daily.   tamsulosin 0.4 MG Caps capsule Commonly known as: FLOMAX Take 1 capsule (0.4 mg total) by mouth daily after supper.        Allergies:  No Known Allergies  Family History: Family History  Problem Relation Age of Onset   Prostate cancer Neg Hx    Bladder Cancer Neg Hx    Kidney cancer Neg Hx     Social History:   reports that he has quit smoking. He has never used smokeless tobacco. He reports that he does not use drugs. No history on file for alcohol use.  Physical Exam: There were no vitals taken for this visit.  Constitutional:  Alert and oriented, no acute distress, nontoxic appearing, frail-appearing HEENT: [place], AT Cardiovascular: No clubbing, cyanosis, or edema Respiratory: Normal respiratory effort, no increased work of breathing Skin: No rashes, bruises or suspicious lesions Neurologic: Grossly intact, no focal deficits, moving all 4 extremities Psychiatric: Normal mood and affect  Catheter Removal  Patient is present today for a catheter removal.  69ml of water was drained from the balloon. A 14FR coude foley cath was removed from the bladder no complications were noted . Patient tolerated well.  Performed by: Carman Ching, PA-C   Assessment & Plan:  1. Urinary retention We discussed that BPH is the most common etiology of urinary retention, but other etiologies include UTI and prostate cancer.  I explained that I cannot rule out prostate cancer without a biopsy, which they continue to decline.  Foley catheter removed in clinic, see procedure note above.  They declined repeat visit for PVR this afternoon due to living far away.  We discussed return precautions including difficulty voiding or frequency similar to his presentation earlier this month prior to hospitalization.  They  expressed understanding.  We will keep him on Flomax and refill for 1 year with plans for annual IPSS and PVR.  He is tolerating it well without orthostasis. - tamsulosin (FLOMAX) 0.4 MG CAPS capsule; Take 1 capsule (0.4 mg total) by mouth daily after supper.  Dispense: 30 capsule; Refill: 11   Return in about 1 year (around 10/27/2022) for Annual IPSS/PVR.  Carman Ching, PA-C  [facility] [address] [place], [place] [address] [phone]

## 2022-01-25 ENCOUNTER — Ambulatory Visit: Payer: Medicare HMO | Admitting: Gastroenterology

## 2022-07-27 ENCOUNTER — Other Ambulatory Visit: Payer: Self-pay | Admitting: Family Medicine

## 2022-07-27 ENCOUNTER — Ambulatory Visit
Admission: RE | Admit: 2022-07-27 | Discharge: 2022-07-27 | Disposition: A | Discharge status: Home / Self Care | Payer: Medicare HMO | Source: Ambulatory Visit | Attending: Family Medicine | Admitting: Family Medicine

## 2022-07-27 ENCOUNTER — Ambulatory Visit
Admission: RE | Admit: 2022-07-27 | Discharge: 2022-07-27 | Disposition: A | Discharge status: Home / Self Care | Payer: Medicare HMO | Attending: Family Medicine | Admitting: Family Medicine

## 2022-07-27 DIAGNOSIS — M549 Dorsalgia, unspecified: Secondary | ICD-10-CM

## 2022-08-08 ENCOUNTER — Telehealth: Payer: Self-pay

## 2022-08-08 NOTE — Telephone Encounter (Signed)
Patient left a message stating she called this morning and left this morning to schedule a appointment and then left a another message at 10:15 to scheduled a appointment. Called and got patient scheduled for 08/23/2022

## 2022-08-11 DIAGNOSIS — D72829 Elevated white blood cell count, unspecified: Secondary | ICD-10-CM | POA: Insufficient documentation

## 2022-08-11 DIAGNOSIS — I959 Hypotension, unspecified: Secondary | ICD-10-CM | POA: Insufficient documentation

## 2022-08-11 DIAGNOSIS — G9520 Unspecified cord compression: Secondary | ICD-10-CM | POA: Insufficient documentation

## 2022-08-11 DIAGNOSIS — E46 Unspecified protein-calorie malnutrition: Secondary | ICD-10-CM | POA: Insufficient documentation

## 2022-08-11 DIAGNOSIS — R6251 Failure to thrive (child): Secondary | ICD-10-CM | POA: Insufficient documentation

## 2022-08-22 ENCOUNTER — Telehealth: Payer: Self-pay

## 2022-08-22 NOTE — Telephone Encounter (Signed)
Patient wife called this morning  stating she left a message on Wednesday stating the patient was admitted in the hospital and needed to cancel the appointment for 08/23/2022. She called back this morning to see if this has been done because she got a reminder call about the appointment. Informed wife it had not been done but I would cancel the appointment.

## 2022-08-23 ENCOUNTER — Ambulatory Visit: Payer: PRIVATE HEALTH INSURANCE | Admitting: Physician Assistant

## 2022-09-05 DEATH — deceased

## 2022-10-27 ENCOUNTER — Ambulatory Visit: Payer: Medicare HMO | Admitting: Physician Assistant
